# Patient Record
Sex: Female | Born: 1985 | Race: Black or African American | Hispanic: No | Marital: Single | Smoking: Never smoker
Health system: Southern US, Community
[De-identification: ages and names within clinical notes are randomized; demographics above are authoritative.]

## PROBLEM LIST (undated history)

## (undated) DIAGNOSIS — N83529 Torsion of fallopian tube, unspecified side: Secondary | ICD-10-CM

## (undated) DIAGNOSIS — Z8619 Personal history of other infectious and parasitic diseases: Secondary | ICD-10-CM

## (undated) DIAGNOSIS — Z98891 History of uterine scar from previous surgery: Secondary | ICD-10-CM

## (undated) HISTORY — DX: Personal history of other infectious and parasitic diseases: Z86.19

---

## 2009-06-08 HISTORY — PX: LEFT OOPHORECTOMY: SHX1961

## 2015-04-09 LAB — HM PAP SMEAR: HM Pap smear: NORMAL

## 2015-11-08 ENCOUNTER — Telehealth: Payer: Self-pay | Admitting: *Deleted

## 2015-11-08 NOTE — Telephone Encounter (Signed)
Unable to reach patient at time of pre-visit call. Left message for patient to return call when available.  

## 2015-11-11 ENCOUNTER — Ambulatory Visit (INDEPENDENT_AMBULATORY_CARE_PROVIDER_SITE_OTHER): Payer: Managed Care, Other (non HMO) | Admitting: Family

## 2015-11-11 ENCOUNTER — Encounter: Payer: Self-pay | Admitting: Family

## 2015-11-11 VITALS — BP 126/82 | HR 81 | Temp 98.6°F | Resp 16 | Ht 60.5 in | Wt 110.2 lb

## 2015-11-11 DIAGNOSIS — F411 Generalized anxiety disorder: Secondary | ICD-10-CM | POA: Insufficient documentation

## 2015-11-11 DIAGNOSIS — R519 Headache, unspecified: Secondary | ICD-10-CM | POA: Insufficient documentation

## 2015-11-11 DIAGNOSIS — R51 Headache: Secondary | ICD-10-CM | POA: Diagnosis not present

## 2015-11-11 LAB — TSH: TSH: 0.53 u[IU]/mL (ref 0.35–4.50)

## 2015-11-11 NOTE — Patient Instructions (Signed)
Please complete lab work prior to leaving. Contact [facility] to schedule an office visit with one of our therapists here at the [facility] office to discuss anxiety.  Their number is [phone] Please schedule a complete physical at the front desk.

## 2015-11-11 NOTE — Assessment & Plan Note (Signed)
Will obtain TSH to evaluate thyroid function and rule out Hyperthyroid. Will also refer to therapist for counseling. If anxiety state worsens or does not improve, could consider addition of medication.

## 2015-11-11 NOTE — Assessment & Plan Note (Signed)
Advised pt not to skip meals and to drink plenty of water.

## 2015-11-11 NOTE — Progress Notes (Signed)
Pre visit review using our clinic review tool, if applicable. No additional management support is needed unless otherwise documented below in the visit note. 

## 2015-11-11 NOTE — Progress Notes (Signed)
[facility]   Subjective:    Patient ID: Heather Welch, female    DOB: 01-08-1986, 30 y.o.   MRN: [id number]  HPI  Ms. Heather Welch is a 30 yr old female who presents today to establish care.  She has two concerns:  "racing thoughts"- Reports that this is new.  Began around November/december of last year.  Deep breathing helps. Reports that it was initially stressful at home due to issue with boyfriend. But now happening more at work.  Feels overwhelmed.  Reports that she has HA,  Rarely eats breakfast.  HA usually occurs before lunch.  Eats lunch and HA resolved.  Reports that she sleeps OK.    Sees Dr. Lovena Le in [place] annually for GYN.   Review of Systems  Constitutional: Negative for unexpected weight change.  HENT: Negative for hearing loss and rhinorrhea.   Eyes: Negative for visual disturbance.  Respiratory: Negative for cough.   Cardiovascular: Negative for leg swelling.  Gastrointestinal: Negative for diarrhea and constipation.  Genitourinary: Negative for dysuria and frequency.  Musculoskeletal: Negative for myalgias and arthralgias.  Skin: Negative for rash.  Neurological: Positive for headaches.  Hematological: Negative for adenopathy.  Psychiatric/Behavioral:       Denies depression   Past Medical History  Diagnosis Date  . History of chicken pox      Social History   Social History  . Marital Status: Single    Spouse Name: N/A  . Number of Children: N/A  . Years of Education: N/A   Occupational History  . Not on file.   Social History Main Topics  . Smoking status: Never Smoker   . Smokeless tobacco: Never Used  . Alcohol Use: Yes     Comment: 1-2 drinks Hennesy every other day  . Drug Use: No  . Sexual Activity: Yes    Birth Control/ Protection: Pill   Other Topics Concern  . 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  Graduated from [employer]   Has a dog who she enjoys, enjoys shopping.      Past Surgical History  Procedure Laterality Date  . Left oophorectomy Left 2011    torsion / cyst    Family History  Problem Relation Age of Onset  . Carpal tunnel syndrome Mother   . Hypertension Mother   . Stroke Father   . Hypertension Father   . Diabetes Father   . Stroke Maternal Grandfather   . Hypertension Maternal Grandfather   . Kidney disease Maternal Grandfather   . Cancer Paternal Grandmother     breast    No Known Allergies  No current outpatient prescriptions on file prior to visit.   No current facility-administered medications on file prior to visit.    BP 126/82 mmHg  Pulse 81  Temp(Src) 98.6 F (37 C) (Oral)  Resp 16  Ht 5' 0.5" (1.537 m)  Wt 110 lb 3.2 oz (49.986 kg)  BMI 21.16 kg/m2  SpO2 100%  LMP 07/10/2015       Objective:   Physical Exam  Constitutional: She is oriented to person, place, and time. She appears well-developed and well-nourished.  HENT:  Head: Normocephalic and atraumatic.  Mouth/Throat: Oropharynx is clear and moist.  Eyes: No scleral icterus.  Neck: Normal range of motion. No thyromegaly present.  Cardiovascular: Normal rate and regular rhythm.   No murmur  heard. Pulmonary/Chest: Effort normal and breath sounds normal. No respiratory distress. She has no wheezes. She has no rales.  Musculoskeletal: She exhibits no edema.  Lymphadenopathy:    She has no cervical adenopathy.  Neurological: She is alert and oriented to person, place, and time.  Skin: Skin is warm and dry.  Psychiatric: Her behavior is normal. Judgment and thought content normal.  Mildly anxious appearing          Assessment & Plan:

## 2015-12-13 ENCOUNTER — Encounter: Payer: Self-pay | Admitting: Family

## 2015-12-13 ENCOUNTER — Ambulatory Visit (INDEPENDENT_AMBULATORY_CARE_PROVIDER_SITE_OTHER): Payer: Managed Care, Other (non HMO) | Admitting: Family

## 2015-12-13 VITALS — BP 110/68 | HR 78 | Temp 98.0°F | Resp 16 | Ht 60.5 in | Wt 111.4 lb

## 2015-12-13 DIAGNOSIS — Z0000 Encounter for general adult medical examination without abnormal findings: Secondary | ICD-10-CM

## 2015-12-13 DIAGNOSIS — Z23 Encounter for immunization: Secondary | ICD-10-CM | POA: Diagnosis not present

## 2015-12-13 LAB — CBC WITH DIFFERENTIAL/PLATELET
BASOS PCT: 0.6 % (ref 0.0–3.0)
Basophils Absolute: 0 10*3/uL (ref 0.0–0.1)
EOS PCT: 1.8 % (ref 0.0–5.0)
Eosinophils Absolute: 0.1 10*3/uL (ref 0.0–0.7)
HEMATOCRIT: 37.5 % (ref 36.0–46.0)
Hemoglobin: 12.8 g/dL (ref 12.0–15.0)
LYMPHS ABS: 1.1 10*3/uL (ref 0.7–4.0)
LYMPHS PCT: 30.8 % (ref 12.0–46.0)
MCHC: 34.1 g/dL (ref 30.0–36.0)
MCV: 88.2 fl (ref 78.0–100.0)
MONOS PCT: 7.8 % (ref 3.0–12.0)
Monocytes Absolute: 0.3 10*3/uL (ref 0.1–1.0)
NEUTROS ABS: 2.1 10*3/uL (ref 1.4–7.7)
NEUTROS PCT: 59 % (ref 43.0–77.0)
PLATELETS: 306 10*3/uL (ref 150.0–400.0)
RBC: 4.25 Mil/uL (ref 3.87–5.11)
RDW: 12.9 % (ref 11.5–15.5)
WBC: 3.6 10*3/uL — ABNORMAL LOW (ref 4.0–10.5)

## 2015-12-13 LAB — LIPID PANEL
CHOLESTEROL: 166 mg/dL (ref 0–200)
HDL: 68.4 mg/dL (ref >39.00)
LDL Cholesterol: 91 mg/dL (ref 0–99)
NONHDL: 97.59
Total CHOL/HDL Ratio: 2
Triglycerides: 31 mg/dL (ref 0.0–149.0)
VLDL: 6.2 mg/dL (ref 0.0–40.0)

## 2015-12-13 LAB — BASIC METABOLIC PANEL
BUN: 18 mg/dL (ref 6–23)
CALCIUM: 9.7 mg/dL (ref 8.4–10.5)
CO2: 31 mEq/L (ref 19–32)
Chloride: 106 mEq/L (ref 96–112)
Creatinine, Ser: 0.57 mg/dL (ref 0.40–1.20)
GFR: 162.69 mL/min (ref >60.00)
GLUCOSE: 75 mg/dL (ref 70–99)
Potassium: 3.9 mEq/L (ref 3.5–5.1)
SODIUM: 140 meq/L (ref 135–145)

## 2015-12-13 LAB — URINALYSIS, ROUTINE W REFLEX MICROSCOPIC
Bilirubin Urine: NEGATIVE
Ketones, ur: NEGATIVE
LEUKOCYTES UA: NEGATIVE
NITRITE: NEGATIVE
PH: 5.5 (ref 5.0–8.0)
Specific Gravity, Urine: 1.02 (ref 1.000–1.030)
TOTAL PROTEIN, URINE-UPE24: NEGATIVE
URINE GLUCOSE: NEGATIVE
UROBILINOGEN UA: 0.2 (ref 0.0–1.0)

## 2015-12-13 LAB — HEPATIC FUNCTION PANEL
ALBUMIN: 4.3 g/dL (ref 3.5–5.2)
ALK PHOS: 31 U/L — AB (ref 39–117)
ALT: 10 U/L (ref 0–35)
AST: 17 U/L (ref 0–37)
Bilirubin, Direct: 0.1 mg/dL (ref 0.0–0.3)
TOTAL PROTEIN: 6.8 g/dL (ref 6.0–8.3)
Total Bilirubin: 0.3 mg/dL (ref 0.2–1.2)

## 2015-12-13 LAB — TSH: TSH: 0.68 u[IU]/mL (ref 0.35–4.50)

## 2015-12-13 NOTE — Progress Notes (Signed)
Subjective:    Patient ID: Heather Welch, female    DOB: 09/04/1985, 30 y.o.   MRN: [id number]  HPI  Patient presents today for complete physical.  Immunizations: due for tetanus Diet: could be better Exercise: Not exercising.  Pap Smear: 11/16, normal per patient, on junel for contraception. [facility]: up to date Vision: scheduled  Anxiety- reports she has not had intermittent anxiety. Has a lot of school work and grandfather passed.  Notes anxiety is not as often. Has not had time to establish with a therapist.   Review of Systems  Constitutional: Negative for unexpected weight change.  HENT: Negative for hearing loss and rhinorrhea.   Eyes: Negative for visual disturbance.  Respiratory: Negative for cough.   Cardiovascular: Negative for leg swelling.  Gastrointestinal: Negative for diarrhea and constipation.  Genitourinary: Negative for dysuria, frequency and menstrual problem.  Musculoskeletal: Negative for myalgias and arthralgias.  Skin: Negative for rash.  Neurological:       Headaches 2 x a day- notes that she still needs to drink more water, eating breakfast seems to be helping  Hematological: Negative for adenopathy.  Psychiatric/Behavioral:       Denies depression symptoms   Past Medical History  Diagnosis Date  . History of chicken pox      Social History   Social History  . Marital Status: Single    Spouse Name: N/A  . Number of Children: N/A  . Years of Education: N/A   Occupational History  . Not on file.   Social History Main Topics  . Smoking status: Never Smoker   . Smokeless tobacco: Never Used  . Alcohol Use: Yes     Comment: 1-2 drinks Hennesy every other day  . Drug Use: No  . Sexual Activity: Yes    Birth Control/ Protection: Pill   Other Topics Concern  . 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   Graduated from [employer]   Has a dog who she enjoys, enjoys shopping.      Past Surgical History  Procedure Laterality Date  . Left oophorectomy Left 2011    torsion / cyst    Family History  Problem Relation Age of Onset  . Carpal tunnel syndrome Mother   . Hypertension Mother   . Stroke Father   . Hypertension Father   . Diabetes Father   . Stroke Maternal Grandfather   . Hypertension Maternal Grandfather   . Kidney disease Maternal Grandfather   . Cancer Paternal Grandmother     breast    No Known Allergies  Current Outpatient Prescriptions on File Prior to Visit  Medication Sig Dispense Refill  . Biotin w/ Vitamins C & E (HAIR SKIN & NAILS GUMMIES PO) Take 2 each by mouth daily.    Colleen Can. JUNEL FE 1/20 1-20 MG-MCG tablet Take 1 tablet by mouth daily.     No current facility-administered medications on file prior to visit.    BP 110/68 mmHg  Pulse 78  Temp(Src) 98 F (36.7 C) (Oral)  Resp 16  Ht 5' 0.5" (1.537 m)  Wt 111 lb 6.4 oz (50.531 kg)  BMI 21.39 kg/m2  LMP 11/13/2015       Objective:   Physical Exam  Physical Exam  Constitutional: She is oriented to person, place, and time. She appears well-developed and well-nourished.  No distress.  HENT:  Head: Normocephalic and atraumatic.  Right Ear: cerumen occludes TM Left Ear: cerumen occludes TM Mouth/Throat: Oropharynx is clear and moist.  Eyes: Pupils are equal, round, and reactive to light. No scleral icterus.  Neck: Normal range of motion. No thyromegaly present.  Cardiovascular: Normal rate and regular rhythm.   No murmur heard. Pulmonary/Chest: Effort normal and breath sounds normal. No respiratory distress. He has no wheezes. She has no rales. She exhibits no tenderness.  Abdominal: Soft. Bowel sounds are normal. He exhibits no distension and no mass. There is no tenderness. There is no rebound and no guarding.  Musculoskeletal: She exhibits no edema.  Lymphadenopathy:    She has no cervical  adenopathy.  Neurological: She is alert and oriented to person, place, and time. She has normal patellar reflexes. She exhibits normal muscle tone. Coordination normal.  Skin: Skin is warm and dry.  Psychiatric: She has a normal mood and affect. Her behavior is normal. Judgment and thought content normal.  Breasts: Examined lying Right: Without masses, retractions, discharge or axillary adenopathy.  Left: Without masses, retractions, discharge or axillary adenopathy.  Pelvis:  deferred        Assessment & Plan:         Assessment & Plan:  Pt counseled on use of otc cerumen removal kits for ceruminosis  Preventative health care- Tdap due today. Discussed healthy diet, exercise. Obtain routine lab work.    Sandford CrazeMelissa O'Sullivan NP

## 2015-12-13 NOTE — Progress Notes (Signed)
Pre visit review using our clinic review tool, if applicable. No additional management support is needed unless otherwise documented below in the visit note. 

## 2015-12-13 NOTE — Addendum Note (Signed)
Addended by: Mervin KungFERGERSON, Logann Whitebread A on: 12/13/2015 01:40 PM   Modules accepted: Orders, SmartSet

## 2015-12-13 NOTE — Patient Instructions (Signed)
Please complete lab work prior to leaving. Try to add regular exercise and focus on healthy diet (more fresh fruits/veggies and water). Call if anxiety symptoms worsen. Follow up in 1 year for annual physical, sooner if problems/concerns.

## 2016-01-16 ENCOUNTER — Telehealth: Payer: Self-pay | Admitting: Family

## 2016-01-16 NOTE — Telephone Encounter (Signed)
PT DROPPED OFF A HEALTH SCREENING FORM FOR [facility] TO FILL OUT, DOCUMENTS PLACED IN TRAY AT FRONT OFFICE, PT STATES SHE WILL PICK UP WHEN READY

## 2016-01-20 NOTE — Telephone Encounter (Signed)
Pt returned call regarding about paperwork. Please advise. Ok to call the same tel #.

## 2016-01-20 NOTE — Telephone Encounter (Addendum)
Health Appeal Form received.   Form filled in as much as possible and [facility] and [facility].

## 2016-01-23 NOTE — Telephone Encounter (Signed)
Pt notified and made aware that form is available for pick up.

## 2016-01-23 NOTE — Telephone Encounter (Signed)
Form completed and signed by provider.  Copy sent for scanning.  Original placed up from for pick up.   

## 2016-03-24 ENCOUNTER — Telehealth: Payer: Self-pay | Admitting: Family

## 2016-03-24 NOTE — Telephone Encounter (Signed)
Patient called stating that she is having severe throat pain. Transferred to [facility] to speak with a nurse

## 2016-03-25 NOTE — Telephone Encounter (Signed)
[facility] note received via fax  Call:   Date: 03/24/16 Time: 1757   Caller: Self Return number: [phone]  Nurse: Cordella RegisterJoshua Wright, RN  Chief Complaint: Mouth Symptoms  Reason for call: Caller states that she has mouth pain she developed an ulcer near her wisdom teeth. Wisdom tooth pain is on left side on top near her wisdom tooth. She can eat, acidic is still painful, feels like the area is blistered over and now there is an ulcer behind her wisdom tooth. There is also a bump on her tongue. Her soft pallet is painful at times, and the ulcer tingles and burns at times. These sites have been bothering her for about 2 weeks.  Related visit to physician within the last 2 weeks: No  Guideline: Mouth Symptoms; all other mouth symptoms (Exceptions: dry mouth from not drinks enough liquids, chapped lips)  Disposition: See PCP within 2 weeks   **Called pt, number listed in invalid. Unable to reach pt to offer to schedule appointment.**

## 2017-05-10 LAB — HM PAP SMEAR: HM PAP: NEGATIVE

## 2018-01-21 ENCOUNTER — Encounter: Payer: Self-pay | Admitting: Family

## 2018-01-21 ENCOUNTER — Ambulatory Visit: Payer: Managed Care, Other (non HMO) | Admitting: Family

## 2018-01-21 VITALS — BP 121/76 | Temp 99.1°F | Resp 16 | Ht 60.0 in | Wt 140.6 lb

## 2018-01-21 DIAGNOSIS — M6740 Ganglion, unspecified site: Secondary | ICD-10-CM | POA: Diagnosis not present

## 2018-01-21 NOTE — Patient Instructions (Signed)
Ganglion Cyst A ganglion cyst is a noncancerous, fluid-filled lump that occurs near joints or tendons. The ganglion cyst grows out of a joint or the lining of a tendon. It most often develops in the hand or wrist, but it can also develop in the shoulder, elbow, hip, knee, ankle, or foot. The round or oval ganglion cyst can be the size of a pea or larger than a grape. Increased activity may enlarge the size of the cyst because more fluid starts to build up. What are the causes? It is not known what causes a ganglion cyst to grow. However, it may be related to:  Inflammation or irritation around the joint.  An injury.  Repetitive movements or overuse.  Arthritis.  What increases the risk? Risk factors include:  Being a woman.  Being age 20-50.  What are the signs or symptoms? Symptoms may include:  A lump. This most often appears on the hand or wrist, but it can occur in other areas of the body.  Tingling.  Pain.  Numbness.  Muscle weakness.  Weak grip.  Less movement in a joint.  How is this diagnosed? Ganglion cysts are most often diagnosed based on a physical exam. Your health care provider will feel the lump and may shine a light alongside it. If it is a ganglion cyst, a light often shines through it. Your health care provider may order an X-ray, ultrasound, or MRI to rule out other conditions. How is this treated? Ganglion cysts usually go away on their own without treatment. If pain or other symptoms are involved, treatment may be needed. Treatment is also needed if the ganglion cyst limits your movement or if it gets infected. Treatment may include:  Wearing a brace or splint on your wrist or finger.  Taking anti-inflammatory medicine.  Draining fluid from the lump with a needle (aspiration).  Injecting a steroid into the joint.  Surgery to remove the ganglion cyst.  Follow these instructions at home:  Do not press on the ganglion cyst, poke it with a  needle, or hit it.  Take medicines only as directed by your health care provider.  Wear your brace or splint as directed by your health care provider.  Watch your ganglion cyst for any changes.  Keep all follow-up visits as directed by your health care provider. This is important. Contact a health care provider if:  Your ganglion cyst becomes larger or more painful.  You have increased redness, red streaks, or swelling.  You have pus coming from the lump.  You have weakness or numbness in the affected area.  You have a fever or chills. This information is not intended to replace advice given to you by your health care provider. Make sure you discuss any questions you have with your health care provider. Document Released: 05/22/2000 Document Revised: 10/31/2015 Document Reviewed: 11/07/2013 Elsevier Interactive Patient Education  2018 Elsevier Inc.  

## 2018-01-21 NOTE — Progress Notes (Signed)
Subjective:    Patient ID: Heather Welch, female    DOB: 03/03/1986, 32 y.o.   MRN: [id number]  HPI   Patient is a 32 yr old female who presents today with chief complaint of cyst on her right wrist.  Denies associated pain.     Review of Systems See HPI  Past Medical History:  Diagnosis Date  . History of chicken pox      Social History   Socioeconomic History  . Marital status: Single    Spouse name: Not on file  . Number of children: Not on file  . Years of education: Not on file  . Highest education level: Not on file  Occupational History  . Not on file  Social Needs  . Financial resource strain: Not on file  . Food insecurity:    Worry: Not on file    Inability: Not on file  . Transportation needs:    Medical: Not on file    Non-medical: Not on file  Tobacco Use  . Smoking status: Never Smoker  . Smokeless tobacco: Never Used  Substance and Sexual Activity  . Alcohol use: Yes    Comment: 1-2 drinks Hennesy every other day  . Drug use: No  . Sexual activity: Yes    Birth control/protection: Pill  Lifestyle  . Physical activity:    Days per week: Not on file    Minutes per session: Not on file  . Stress: Not on file  Relationships  . Social connections:    Talks on phone: Not on file    Gets together: Not on file    Attends religious service: Not on file    Active member of club or organization: Not on file    Attends meetings of clubs or organizations: Not on file    Relationship status: Not on file  . Intimate partner violence:    Fear of current or ex partner: Not on file    Emotionally abused: Not on file    Physically abused: Not on file    Forced sexual activity: Not on file  Other Topics Concern  . 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  Graduated from [employer]   Has a dog who she enjoys, enjoys shopping.      Past Surgical  History:  Procedure Laterality Date  . LEFT OOPHORECTOMY Left 2011   torsion / cyst    Family History  Problem Relation Age of Onset  . Carpal tunnel syndrome Mother   . Hypertension Mother   . Stroke Father   . Hypertension Father   . Diabetes Father   . Stroke Maternal Grandfather   . Hypertension Maternal Grandfather   . Kidney disease Maternal Grandfather   . Cancer Paternal Grandmother        breast    No Known Allergies  Current Outpatient Medications on File Prior to Visit  Medication Sig Dispense Refill  . Prenatal Vit-DSS-Fe Fum-FA (PRENATAL 19) tablet Take 1 tablet by mouth daily.  3   No current facility-administered medications on file prior to visit.     BP 121/76 (BP Location: Right Arm, Patient Position: Sitting, Cuff Size: Small)   Temp 99.1 F (37.3 C) (Oral)   Resp 16   Ht 5' (1.524 m)   Wt 140 lb 9.6 oz (63.8 kg)   LMP 01/09/2018  SpO2 100%   BMI 27.46 kg/m       Objective:   Physical Exam  Constitutional: She is oriented to person, place, and time. She appears well-developed and well-nourished.  Pulmonary/Chest: Effort normal. She has no wheezes.  Musculoskeletal:  Small mobile ganglion cyst noted righ tlateral wrist.  Neurological: She is alert and oriented to person, place, and time.  Skin: Skin is warm and dry.  Psychiatric: She has a normal mood and affect. Her behavior is normal. Judgment and thought content normal.          Assessment & Plan:  Ganglion cyst- reassurance provided.  Advised pt to let me know if enlarged or becomes painful.

## 2018-02-28 ENCOUNTER — Encounter: Payer: Self-pay | Admitting: Family

## 2018-02-28 ENCOUNTER — Ambulatory Visit (INDEPENDENT_AMBULATORY_CARE_PROVIDER_SITE_OTHER): Payer: Managed Care, Other (non HMO) | Admitting: Family

## 2018-02-28 VITALS — BP 120/82 | HR 83 | Temp 98.3°F | Resp 18 | Ht 60.0 in | Wt 141.2 lb

## 2018-02-28 DIAGNOSIS — Z23 Encounter for immunization: Secondary | ICD-10-CM | POA: Diagnosis not present

## 2018-02-28 DIAGNOSIS — Z0000 Encounter for general adult medical examination without abnormal findings: Secondary | ICD-10-CM

## 2018-02-28 NOTE — Patient Instructions (Addendum)
Please complete lab work prior to leaving.  Try to add 30 minutes of cardio 5 days a week. Schedule follow up with GYN for pap smear.  Try to prepare fresh healthy foods at home ahead of time and then bring food with you to work. Eat 3 meals a day and make sure you have a fresh fruit and/or veggie with each meal as well as a lean protein (low fat yogurt/cottage cheese, chicken, Malaw[place]urkey or fish). Goal weight <120.

## 2018-02-28 NOTE — Progress Notes (Signed)
Subjective:    Patient ID: Heather Welch, female    DOB: [birth year], 32 y.o.   MRN: [id number]  HPI  Patient presents today for complete physical.  Immunizations: tdap 2017, flu shot today Diet: diet is fair Exercise: not exercising, active at work.  Pap Smear: Due in November.   Vision: due will schedule Dental:up to date Wt Readings from Last 3 Encounters:  02/28/18 141 lb 3.2 oz (64 kg)  01/21/18 140 lb 9.6 oz (63.8 kg)  12/13/15 111 lb 6.4 oz (50.5 kg)    Review of Systems  Constitutional: Negative for unexpected weight change.  HENT: Negative for hearing loss and rhinorrhea.   Eyes: Negative for visual disturbance.  Respiratory: Negative for cough.   Cardiovascular: Negative for leg swelling.  Gastrointestinal: Negative for blood in stool, constipation and diarrhea.  Genitourinary: Negative for dysuria, frequency and hematuria.  Musculoskeletal: Negative for arthralgias and myalgias.  Neurological: Negative for headaches.  Hematological: Negative for adenopathy.  Psychiatric/Behavioral:       Denies depression/anxiety   Past Medical History:  Diagnosis Date  . History of chicken pox      Social History   Socioeconomic History  . Marital status: Single    Spouse name: Not on file  . Number of children: Not on file  . Years of education: Not on file  . Highest education level: Not on file  Occupational History  . Not on file  Social Needs  . Financial resource strain: Not on file  . Food insecurity:    Worry: Not on file    Inability: Not on file  . Transportation needs:    Medical: Not on file    Non-medical: Not on file  Tobacco Use  . Smoking status: Never Smoker  . Smokeless tobacco: Never Used  Substance and Sexual Activity  . Alcohol use: Yes    Comment: 1-2 drinks [place] every weekend  . Drug use: No  . Sexual activity: Yes    Birth control/protection: Pill  Lifestyle  . Physical activity:    Days per week: Not on file    Minutes per  session: Not on file  . Stress: Not on file  Relationships  . Social connections:    Talks on phone: Not on file    Gets together: Not on file    Attends religious service: Not on file    Active member of club or organization: Not on file    Attends meetings of clubs or organizations: Not on file    Relationship status: Not on file  . Intimate partner violence:    Fear of current or ex partner: Not on file    Emotionally abused: Not on file    Physically abused: Not on file    Forced sexual activity: Not on file  Other Topics Concern  . 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  Graduated from [employer]   Has a dog who she enjoys, enjoys shopping.      Past Surgical History:  Procedure Laterality Date  . LEFT OOPHORECTOMY Left 2011   torsion / cyst    Family History  Problem Relation Age of Onset  . Carpal tunnel syndrome Mother   . Hypertension Mother   . Stroke Father   . Hypertension Father   . Diabetes Father   . Stroke Maternal Grandfather   .  Hypertension Maternal Grandfather   . Kidney disease Maternal Grandfather   . Cancer Paternal Grandmother        breast    No Known Allergies  Current Outpatient Medications on File Prior to Visit  Medication Sig Dispense Refill  . Prenatal Vit-DSS-Fe Fum-FA (PRENATAL 19) tablet Take 1 tablet by mouth daily.  3   No current facility-administered medications on file prior to visit.     BP 120/82 (BP Location: Right Arm, Cuff Size: Normal)   Pulse 83   Temp 98.3 F (36.8 C) (Oral)   Resp 18   Ht 5' (1.524 m)   Wt 141 lb 3.2 oz (64 kg)   LMP 02/27/2018   SpO2 99%   BMI 27.58 kg/m       Objective:   Physical Exam  Physical Exam  Constitutional: She is oriented to person, place, and time. She appears well-developed and well-nourished. No distress.  HENT:  Head: Normocephalic and atraumatic.  Right  Ear: Tympanic membrane and ear canal normal.  Left Ear: Tympanic membrane and ear canal normal.  Mouth/Throat: Oropharynx is clear and moist.  Eyes: Pupils are equal, round, and reactive to light. No scleral icterus.  Neck: Normal range of motion. No thyromegaly present.  Cardiovascular: Normal rate and regular rhythm.   No murmur heard. Pulmonary/Chest: Effort normal and breath sounds normal. No respiratory distress. He has no wheezes. She has no rales. She exhibits no tenderness.  Abdominal: Soft. Bowel sounds are normal. She exhibits no distension and no mass. There is no tenderness. There is no rebound and no guarding.  Musculoskeletal: She exhibits no edema.  Lymphadenopathy:    She has no cervical adenopathy.  Neurological: She is alert and oriented to person, place, and time. She has normal patellar reflexes. She exhibits normal muscle tone. Coordination normal.  Skin: Skin is warm and dry.  Psychiatric: She has a normal mood and affect. Her behavior is normal. Judgment and thought content normal.  Breasts: Examined lying Right: Without masses, retractions, discharge or axillary adenopathy.  Left: Without masses, retractions, discharge or axillary adenopathy.  Pelvic: deferred to GYN.             Assessment & Plan:   Preventative care- flu shot today, will schedule pap with GYN.  Obtain routine lab work. Pt is advised as follows:  Please complete lab work prior to leaving.  Try to add 30 minutes of cardio 5 days a week. Schedule follow up with GYN for pap smear.  Try to prepare fresh healthy foods at home ahead of time and then bring food with you to work. Eat 3 meals a day and make sure you have a fresh fruit and/or veggie with each meal as well as a lean protein (low fat yogurt/cottage cheese, chicken, Malaw[place]urkey or fish). Goal weight <120.        Assessment & Plan:

## 2018-02-28 NOTE — Addendum Note (Signed)
Addended by: Mervin KungFERGERSON, Iana Buzan A on: 02/28/2018 04:43 PM   Modules accepted: Orders

## 2018-03-01 LAB — LIPID PANEL
Cholesterol: 166 mg/dL (ref 0–200)
HDL: 56.3 mg/dL (ref >39.00)
LDL CALC: 87 mg/dL (ref 0–99)
NONHDL: 109.53
Total CHOL/HDL Ratio: 3
Triglycerides: 113 mg/dL (ref 0.0–149.0)
VLDL: 22.6 mg/dL (ref 0.0–40.0)

## 2018-03-01 LAB — CBC WITH DIFFERENTIAL/PLATELET
BASOS ABS: 0.1 10*3/uL (ref 0.0–0.1)
Basophils Relative: 1.1 % (ref 0.0–3.0)
EOS ABS: 0.1 10*3/uL (ref 0.0–0.7)
Eosinophils Relative: 1.7 % (ref 0.0–5.0)
HCT: 39.2 % (ref 36.0–46.0)
Hemoglobin: 13 g/dL (ref 12.0–15.0)
LYMPHS ABS: 2 10*3/uL (ref 0.7–4.0)
Lymphocytes Relative: 31.9 % (ref 12.0–46.0)
MCHC: 33.2 g/dL (ref 30.0–36.0)
MCV: 91.2 fl (ref 78.0–100.0)
MONO ABS: 0.5 10*3/uL (ref 0.1–1.0)
MONOS PCT: 8.3 % (ref 3.0–12.0)
NEUTROS PCT: 57 % (ref 43.0–77.0)
Neutro Abs: 3.5 10*3/uL (ref 1.4–7.7)
PLATELETS: 281 10*3/uL (ref 150.0–400.0)
RBC: 4.31 Mil/uL (ref 3.87–5.11)
RDW: 13 % (ref 11.5–15.5)
WBC: 6.2 10*3/uL (ref 4.0–10.5)

## 2018-03-01 LAB — HEPATIC FUNCTION PANEL
ALK PHOS: 47 U/L (ref 39–117)
ALT: 12 U/L (ref 0–35)
AST: 18 U/L (ref 0–37)
Albumin: 4.5 g/dL (ref 3.5–5.2)
BILIRUBIN DIRECT: 0.1 mg/dL (ref 0.0–0.3)
BILIRUBIN TOTAL: 0.3 mg/dL (ref 0.2–1.2)
Total Protein: 6.8 g/dL (ref 6.0–8.3)

## 2018-03-01 LAB — BASIC METABOLIC PANEL
BUN: 17 mg/dL (ref 6–23)
CHLORIDE: 102 meq/L (ref 96–112)
CO2: 29 mEq/L (ref 19–32)
Calcium: 9.8 mg/dL (ref 8.4–10.5)
Creatinine, Ser: 0.74 mg/dL (ref 0.40–1.20)
GFR: 118.52 mL/min (ref >60.00)
GLUCOSE: 82 mg/dL (ref 70–99)
POTASSIUM: 4 meq/L (ref 3.5–5.1)
SODIUM: 137 meq/L (ref 135–145)

## 2018-03-01 LAB — URINALYSIS, ROUTINE W REFLEX MICROSCOPIC
Bilirubin Urine: NEGATIVE
Ketones, ur: NEGATIVE
Leukocytes, UA: NEGATIVE
Nitrite: NEGATIVE
SPECIFIC GRAVITY, URINE: 1.02 (ref 1.000–1.030)
TOTAL PROTEIN, URINE-UPE24: NEGATIVE
Urine Glucose: NEGATIVE
Urobilinogen, UA: 0.2 (ref 0.0–1.0)
pH: 6.5 (ref 5.0–8.0)

## 2018-03-01 LAB — TSH: TSH: 0.55 u[IU]/mL (ref 0.35–4.50)

## 2018-03-07 ENCOUNTER — Encounter: Payer: Self-pay | Admitting: Family

## 2018-03-31 ENCOUNTER — Telehealth: Payer: Self-pay | Admitting: *Deleted

## 2018-03-31 NOTE — Telephone Encounter (Signed)
Received Medical records from [facility]; forwarded to provider/SLS 10/24

## 2018-05-23 ENCOUNTER — Encounter: Payer: Self-pay | Admitting: Family

## 2018-05-23 ENCOUNTER — Ambulatory Visit: Payer: Managed Care, Other (non HMO) | Admitting: Family

## 2018-05-23 VITALS — BP 128/87 | HR 71 | Temp 99.8°F | Resp 16 | Ht 60.0 in | Wt 136.0 lb

## 2018-05-23 DIAGNOSIS — R109 Unspecified abdominal pain: Secondary | ICD-10-CM | POA: Diagnosis not present

## 2018-05-23 LAB — POC URINALSYSI DIPSTICK (AUTOMATED)
Bilirubin, UA: NEGATIVE
Glucose, UA: NEGATIVE
Ketones, UA: NEGATIVE
LEUKOCYTES UA: NEGATIVE
Nitrite, UA: NEGATIVE
Protein, UA: POSITIVE — AB
Spec Grav, UA: 1.015 (ref 1.010–1.025)
UROBILINOGEN UA: NEGATIVE U/dL — AB
pH, UA: 6 (ref 5.0–8.0)

## 2018-05-23 LAB — POCT URINE PREGNANCY: PREG TEST UR: NEGATIVE

## 2018-05-23 NOTE — Patient Instructions (Addendum)
Please complete lab work prior to leaving. Call if symptoms worsen or if symptoms do not continue to improve.  We will call you in the AM about scheduling your CT scan to rule out kidney stone.

## 2018-05-23 NOTE — Progress Notes (Signed)
Subjective:    Patient ID: Heather Welch, female    DOB: [birth year], 32 y.o.   MRN: [id number]  HPI  Right sided flank pain on Friday night. Could not get comfortable. Then developed nausea/vomitted x 1 Friday night.  Notes that she took excedrin PM, woke up at 2AM and vomited again. Went ot the urgent care and was prescribed ibuprofen 600mg .  Reports that she had microscopic blood in her urine but that she also had her menstrual cycle that day. She is trying to get pregnant.  LMP started 05/19/18.  She reports resolution of her pain today.  Today she has had no pain- like 2-3/10.    Review of Systems  See HPI  Past Medical History:  Diagnosis Date  . History of chicken pox      Social History   Socioeconomic History  . Marital status: Single    Spouse name: Not on file  . Number of children: Not on file  . Years of education: Not on file  . Highest education level: Not on file  Occupational History  . Not on file  Social Needs  . Financial resource strain: Not on file  . Food insecurity:    Worry: Not on file    Inability: Not on file  . Transportation needs:    Medical: Not on file    Non-medical: Not on file  Tobacco Use  . Smoking status: Never Smoker  . Smokeless tobacco: Never Used  Substance and Sexual Activity  . Alcohol use: Yes    Comment: 1-2 drinks [place] every weekend  . Drug use: No  . Sexual activity: Yes    Birth control/protection: Pill  Lifestyle  . Physical activity:    Days per week: Not on file    Minutes per session: Not on file  . Stress: Not on file  Relationships  . Social connections:    Talks on phone: Not on file    Gets together: Not on file    Attends religious service: Not on file    Active member of club or organization: Not on file    Attends meetings of clubs or organizations: Not on file    Relationship status: Not on file  . Intimate partner violence:    Fear of current or ex partner: Not on file    Emotionally abused:  Not on file    Physically abused: Not on file    Forced sexual activity: Not on file  Other Topics Concern  . 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  Graduated from [employer]   Has a dog who she enjoys, enjoys shopping.      Past Surgical History:  Procedure Laterality Date  . LEFT OOPHORECTOMY Left 2011   torsion / cyst    Family History  Problem Relation Age of Onset  . Carpal tunnel syndrome Mother   . Hypertension Mother   . Stroke Father   . Hypertension Father   . Diabetes Father   . Stroke Maternal Grandfather   . Hypertension Maternal Grandfather   . Kidney disease Maternal Grandfather   . Cancer Paternal Grandmother        breast    No Known Allergies  Current Outpatient Medications on File Prior to Visit  Medication Sig Dispense Refill  . Prenatal Vit-DSS-Fe Fum-FA (PRENATAL 19) tablet Take 1 tablet by  mouth daily.  3   No current facility-administered medications on file prior to visit.     BP 128/87 (BP Location: Right Arm, Patient Position: Sitting, Cuff Size: Small)   Pulse 71   Temp 99.8 F (37.7 C) (Oral)   Resp 16   Ht 5' (1.524 m)   Wt 136 lb (61.7 kg)   LMP 05/19/2018   SpO2 100%   BMI 26.56 kg/m        Objective:   Physical Exam Constitutional:      Appearance: She is well-developed.  Neck:     Musculoskeletal: Neck supple.     Thyroid: No thyromegaly.  Cardiovascular:     Rate and Rhythm: Normal rate and regular rhythm.     Heart sounds: Normal heart sounds. No murmur.  Pulmonary:     Effort: Pulmonary effort is normal. No respiratory distress.     Breath sounds: Normal breath sounds. No wheezing.  Abdominal:     General: There is no distension.     Tenderness: There is no abdominal tenderness.  Skin:    General: Skin is warm and dry.  Neurological:     Mental Status: She is alert and oriented to person, place, and  time.  Psychiatric:        Behavior: Behavior normal.        Thought Content: Thought content normal.        Judgment: Judgment normal.           Assessment & Plan:  Right sided flank pain- low grade temp. Will send UA for culture and and obtain cbc, bmet. Will also obtain a CT to rule out stone.

## 2018-05-24 ENCOUNTER — Telehealth: Payer: Self-pay | Admitting: Family

## 2018-05-24 LAB — CBC WITH DIFFERENTIAL/PLATELET
BASOS PCT: 1 % (ref 0.0–3.0)
Basophils Absolute: 0.1 10*3/uL (ref 0.0–0.1)
EOS PCT: 1.1 % (ref 0.0–5.0)
Eosinophils Absolute: 0.1 10*3/uL (ref 0.0–0.7)
HEMATOCRIT: 40.2 % (ref 36.0–46.0)
HEMOGLOBIN: 13.4 g/dL (ref 12.0–15.0)
LYMPHS PCT: 31.8 % (ref 12.0–46.0)
Lymphs Abs: 1.9 10*3/uL (ref 0.7–4.0)
MCHC: 33.4 g/dL (ref 30.0–36.0)
MCV: 91.8 fl (ref 78.0–100.0)
Monocytes Absolute: 0.6 10*3/uL (ref 0.1–1.0)
Monocytes Relative: 9.9 % (ref 3.0–12.0)
Neutro Abs: 3.4 10*3/uL (ref 1.4–7.7)
Neutrophils Relative %: 56.2 % (ref 43.0–77.0)
Platelets: 285 10*3/uL (ref 150.0–400.0)
RBC: 4.38 Mil/uL (ref 3.87–5.11)
RDW: 12.9 % (ref 11.5–15.5)
WBC: 6.1 10*3/uL (ref 4.0–10.5)

## 2018-05-24 LAB — BASIC METABOLIC PANEL
BUN: 18 mg/dL (ref 6–23)
CHLORIDE: 105 meq/L (ref 96–112)
CO2: 29 mEq/L (ref 19–32)
Calcium: 9.8 mg/dL (ref 8.4–10.5)
Creatinine, Ser: 1.05 mg/dL (ref 0.40–1.20)
GFR: 79.02 mL/min (ref >60.00)
Glucose, Bld: 91 mg/dL (ref 70–99)
POTASSIUM: 5.1 meq/L (ref 3.5–5.1)
SODIUM: 143 meq/L (ref 135–145)

## 2018-05-24 NOTE — Telephone Encounter (Signed)
Heather MouldingRuth- I spoke with pt and she states she has not had her CT scheduled yet, feeling about the same. Can you please check with Dedra SkeensGwen and make sure this gets scheduled on 12/18?

## 2018-05-25 ENCOUNTER — Ambulatory Visit (HOSPITAL_BASED_OUTPATIENT_CLINIC_OR_DEPARTMENT_OTHER)
Admission: RE | Admit: 2018-05-25 | Discharge: 2018-05-25 | Disposition: A | Discharge status: Home / Self Care | Payer: Managed Care, Other (non HMO) | Source: Ambulatory Visit | Attending: Family | Admitting: Family

## 2018-05-25 ENCOUNTER — Other Ambulatory Visit: Payer: Self-pay

## 2018-05-25 DIAGNOSIS — R3129 Other microscopic hematuria: Secondary | ICD-10-CM | POA: Diagnosis present

## 2018-05-25 DIAGNOSIS — R109 Unspecified abdominal pain: Secondary | ICD-10-CM | POA: Diagnosis not present

## 2018-05-25 LAB — URINE CULTURE
MICRO NUMBER:: 91508701
SPECIMEN QUALITY:: ADEQUATE

## 2018-05-25 NOTE — Telephone Encounter (Signed)
Ct was cancelled, Ok per FleetwoodMelissa she will get a rus and abd xray. Patient advised test have been order she will call radiology to get a time for this studies.

## 2018-05-26 ENCOUNTER — Telehealth: Payer: Self-pay | Admitting: Internal Medicine

## 2018-05-26 NOTE — Telephone Encounter (Signed)
Advise patient: Urine culture was negative, renal ultrasound showed no problems on her kidneys; she does have uterine fibroid and needs to discuss that with her gynecologist.  The x-ray of the abdomen was normal. She was seen with right-sided flank pain and a low-grade temp.  No evidence of infection, if she is not much better let me know.  If she is feeling a lot worse she needs to be seen tomorrow

## 2018-05-26 NOTE — Telephone Encounter (Signed)
Results given to patient, she reports she is feeling better. She asked for her results to be fax[facility] [place]

## 2018-05-27 NOTE — Telephone Encounter (Signed)
Records faxed to her OBGYN

## 2018-08-19 ENCOUNTER — Other Ambulatory Visit: Payer: Self-pay

## 2018-08-19 ENCOUNTER — Encounter: Payer: Self-pay | Admitting: Family

## 2018-08-19 ENCOUNTER — Other Ambulatory Visit (HOSPITAL_COMMUNITY)
Admission: RE | Admit: 2018-08-19 | Discharge: 2018-08-19 | Disposition: A | Discharge status: Home / Self Care | Payer: Managed Care, Other (non HMO) | Source: Ambulatory Visit | Attending: Family | Admitting: Family

## 2018-08-19 ENCOUNTER — Ambulatory Visit: Payer: Managed Care, Other (non HMO) | Admitting: Family

## 2018-08-19 VITALS — BP 118/75 | HR 82 | Temp 99.0°F | Resp 16 | Ht 60.0 in | Wt 139.0 lb

## 2018-08-19 DIAGNOSIS — N760 Acute vaginitis: Secondary | ICD-10-CM | POA: Diagnosis not present

## 2018-08-19 MED ORDER — FLUCONAZOLE 150 MG PO TABS: 150.0000 mg | ORAL_TABLET | Freq: Once | ORAL | 0 refills | Status: AC

## 2018-08-19 NOTE — Patient Instructions (Signed)
Please continue metronidazole. Start diflucan. We will contact you with your results.

## 2018-08-19 NOTE — Addendum Note (Signed)
Addended by: Wilford Corner on: 08/19/2018 03:41 PM   Modules accepted: Orders

## 2018-08-19 NOTE — Progress Notes (Signed)
Subjective:    Patient ID: Heather Welch, female    DOB: June 15, 1985, 33 y.o.   MRN: [id number]  HPI  Patient is a 33 year old female who presents today with chief complaint of vaginal discharge.  She reports that discharge began on 08/17/2018.  She saw gynecology that day and was given a prescription for metronidazole that same day.  She reports that she developed some vaginal itching which started last night.  Reports that discharge was initially white, now more cloudy.  Reports that she now is having some vulvar itching.  Denies new partners.    Review of Systems See HPI  Past Medical History:  Diagnosis Date  . History of chicken pox      Social History   Socioeconomic History  . Marital status: Single    Spouse name: Not on file  . Number of children: Not on file  . Years of education: Not on file  . Highest education level: Not on file  Occupational History  . Not on file  Social Needs  . Financial resource strain: Not on file  . Food insecurity:    Worry: Not on file    Inability: Not on file  . Transportation needs:    Medical: Not on file    Non-medical: Not on file  Tobacco Use  . Smoking status: Never Smoker  . Smokeless tobacco: Never Used  Substance and Sexual Activity  . Alcohol use: Yes    Comment: 1-2 drinks [place] every weekend  . Drug use: No  . Sexual activity: Yes    Birth control/protection: Pill  Lifestyle  . Physical activity:    Days per week: Not on file    Minutes per session: Not on file  . Stress: Not on file  Relationships  . Social connections:    Talks on phone: Not on file    Gets together: Not on file    Attends religious service: Not on file    Active member of club or organization: Not on file    Attends meetings of clubs or organizations: Not on file    Relationship status: Not on file  . Intimate partner violence:    Fear of current or ex partner: Not on file    Emotionally abused: Not on file    Physically abused:  Not on file    Forced sexual activity: Not on file  Other Topics Concern  . 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  Graduated from [employer]   Has a dog who she enjoys, enjoys shopping.      Past Surgical History:  Procedure Laterality Date  . LEFT OOPHORECTOMY Left 2011   torsion / cyst    Family History  Problem Relation Age of Onset  . Carpal tunnel syndrome Mother   . Hypertension Mother   . Stroke Father   . Hypertension Father   . Diabetes Father   . Stroke Maternal Grandfather   . Hypertension Maternal Grandfather   . Kidney disease Maternal Grandfather   . Cancer Paternal Grandmother        breast    No Known Allergies  Current Outpatient Medications on File Prior to Visit  Medication Sig Dispense Refill  . metroNIDAZOLE (FLAGYL) 500 MG tablet     . Prenatal Vit-DSS-Fe Fum-FA (PRENATAL 19) tablet Take 1 tablet by mouth daily.  3  No current facility-administered medications on file prior to visit.     BP 118/75 (BP Location: Right Arm, Patient Position: Sitting, Cuff Size: Small)   Pulse 82   Temp 99 F (37.2 C) (Oral)   Resp 16   Ht 5' (1.524 m)   Wt 139 lb (63 kg)   LMP 08/04/2018   SpO2 100%   BMI 27.15 kg/m       Objective:   Physical Exam Constitutional:      Appearance: Normal appearance.  Neurological:     General: No focal deficit present.     Mental Status: She is alert.  Psychiatric:        Mood and Affect: Mood normal.        Behavior: Behavior normal.   GYN:  Thin white vaginal discharge        Assessment & Plan:  Vaginitis- new.  Will rx with diflucan. Continue metronidazole pending review of swab results. Swab to be sent for BV, Yeast, GC/Chlamydia, trich.

## 2018-08-22 LAB — CERVICOVAGINAL ANCILLARY ONLY
Bacterial vaginitis: POSITIVE — AB
Candida vaginitis: POSITIVE — AB
Chlamydia: NEGATIVE
Neisseria Gonorrhea: NEGATIVE
TRICH (WINDOWPATH): NEGATIVE

## 2018-08-23 ENCOUNTER — Telehealth: Payer: Self-pay | Admitting: Family

## 2018-08-23 MED ORDER — FLUCONAZOLE 150 MG PO TABS: ORAL_TABLET | ORAL | 0 refills | Status: DC

## 2018-08-23 NOTE — Telephone Encounter (Signed)
Please contact pt and let her know that her wet prep shows bacterial vaginosis and yeast. Complete metronidazole for BV. Rx sent for diflucan for yeast.

## 2018-08-23 NOTE — Telephone Encounter (Signed)
Results given to patient, advised of rx for diflucan at pharmacy.

## 2018-09-17 ENCOUNTER — Telehealth: Payer: Self-pay | Admitting: Family

## 2018-09-17 NOTE — Telephone Encounter (Signed)
Copied from CRM [id number]. Topic: Appointment Scheduling - Scheduling Inquiry for Clinic >> Sep 16, 2018  2:48 PM [place], [place] wrote: Reason for CRM: Pt stated her throat is sore and she would like to schedule virtual visit. Pt requests call back.

## 2018-09-18 ENCOUNTER — Telehealth: Payer: Self-pay | Admitting: Family

## 2018-09-18 NOTE — Telephone Encounter (Signed)
Opened in error

## 2018-09-21 NOTE — Telephone Encounter (Signed)
Per patient she received a tele-visit from another physician before we called her and she was given a rx for penicillin she is feeling better. Advised patient to call us for a virtual visits if she has any other concerns.

## 2018-11-03 ENCOUNTER — Telehealth: Payer: Self-pay

## 2018-11-03 NOTE — Telephone Encounter (Signed)
Copied from CRM [id number]. Topic: General - Other >> Nov 03, 2018  7:48 AM Heather Welch wrote: Reason for CRM: Patient called to make an appt. To speak with the doctor regarding whether she should continue to see Dr. Peggyann Juba since finding out that she is pregnant.  Patient is not sure if she should seek an OB GYN doctor or if she can continue to see Dr. Peggyann Juba.  Please advise and call patient back to schedule and discuss.  CB# [phone]

## 2018-11-09 NOTE — Telephone Encounter (Signed)
I will need to see her first via virtual visit and then I can refer her to an OB/GYN.

## 2018-11-09 NOTE — Telephone Encounter (Signed)
Called patient but no answer lvm to call for virtual visit.

## 2018-11-11 NOTE — Telephone Encounter (Signed)
lvm again today patient to call us back if she still needs an appt

## 2020-06-28 IMAGING — US US RENAL
1 series · 14 of 25 positions shown · non-contrast
Comparison: None.

CLINICAL DATA: Right flank pain and microhematuria.

EXAM:
RENAL / URINARY TRACT ULTRASOUND COMPLETE

[Series 1: us renal · 0.22mm/px · 14 of 49 slices shown]
[im 1/49]
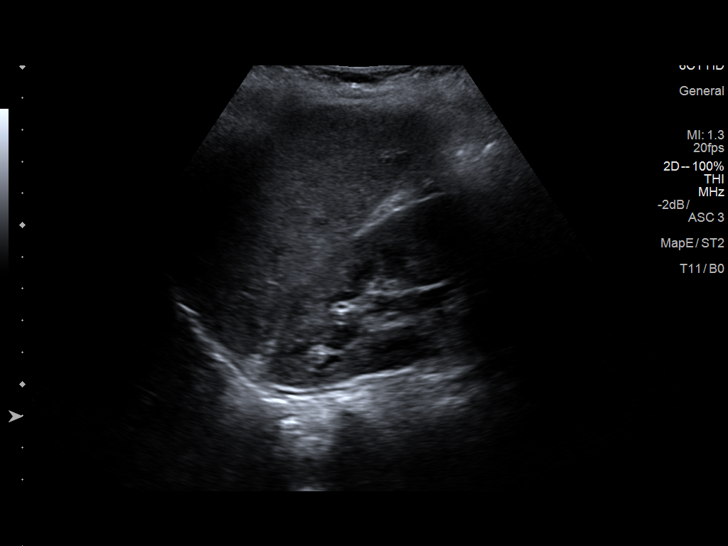
[im 5/49]
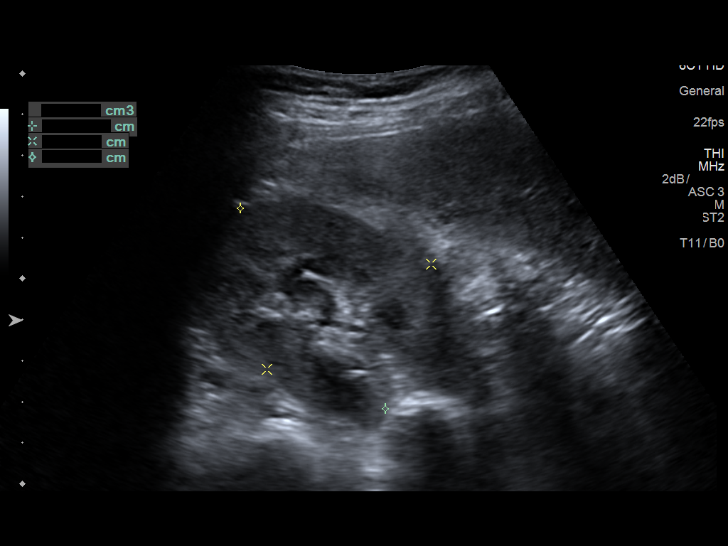
[im 9/49]
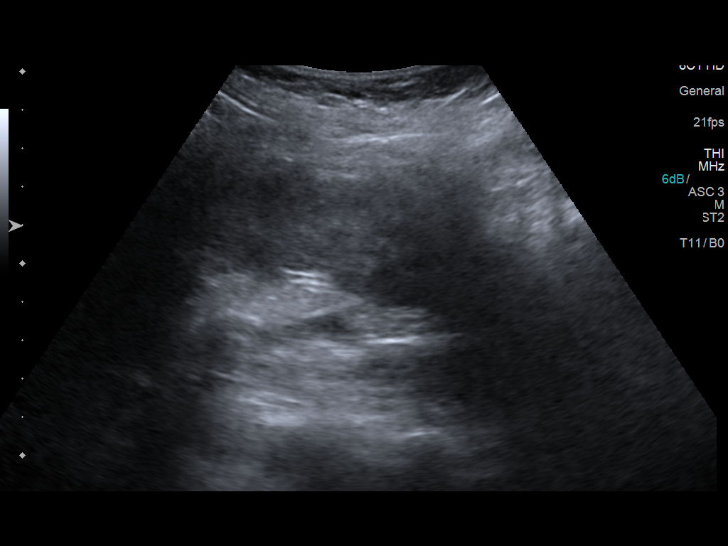
[im 13/49]
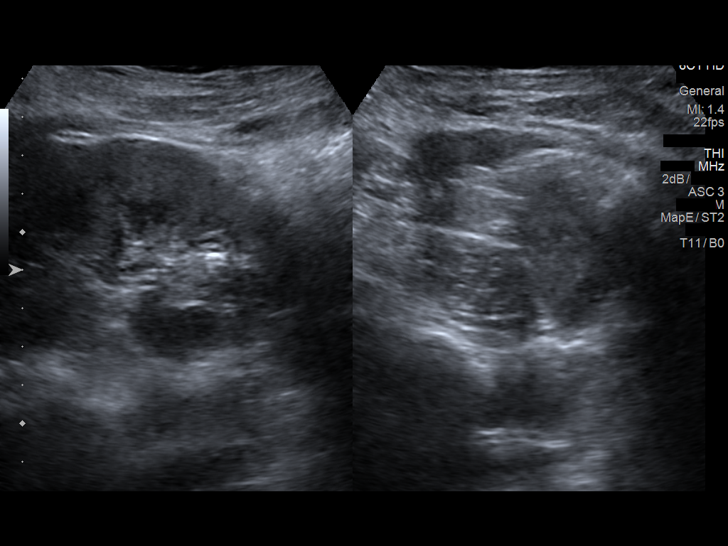
[im 17/49]
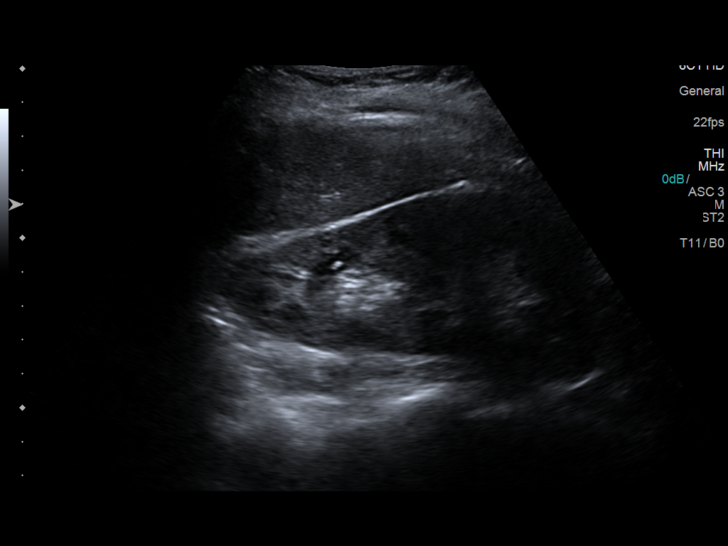
[im 19/49]
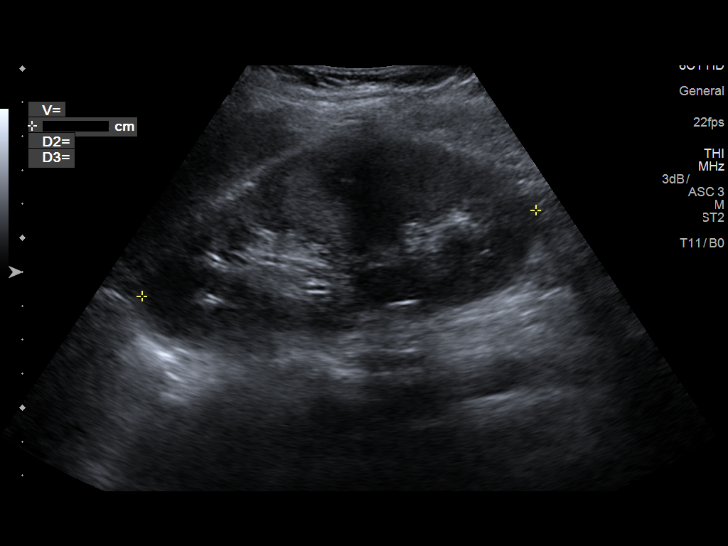
[im 23/49]
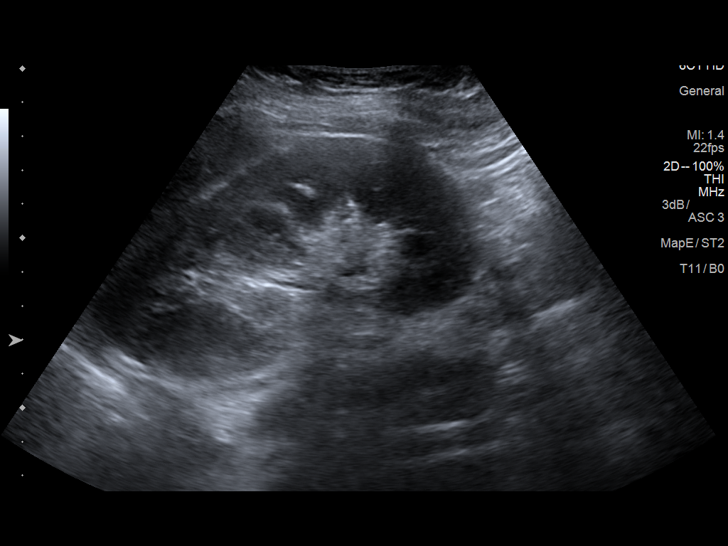
[im 27/49]
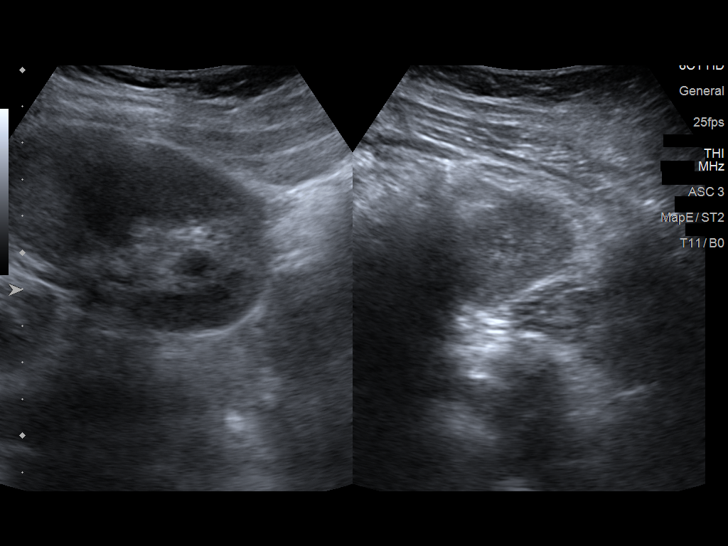
[im 31/49]
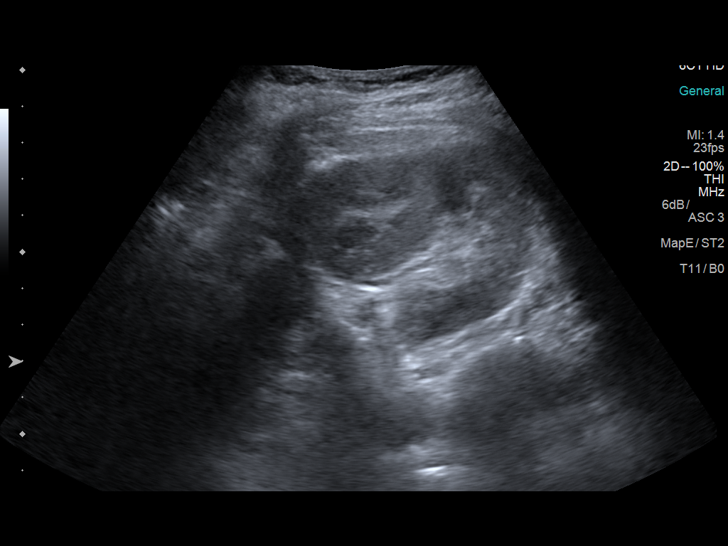
[im 33/49]
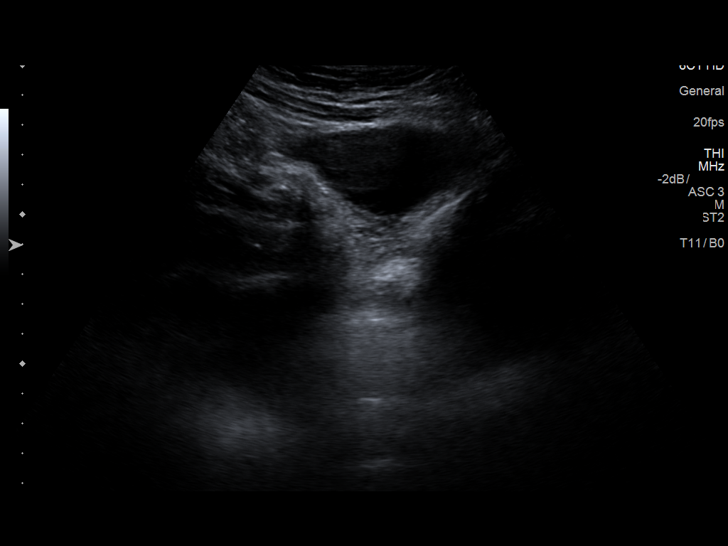
[im 37/49]
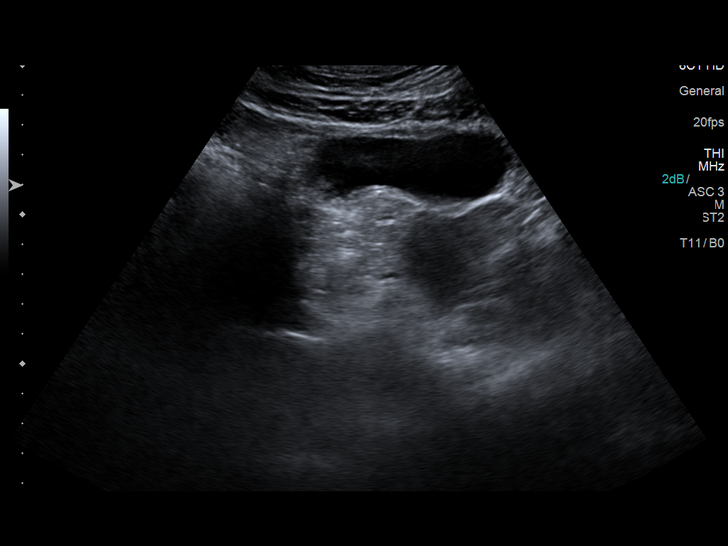
[im 41/49]
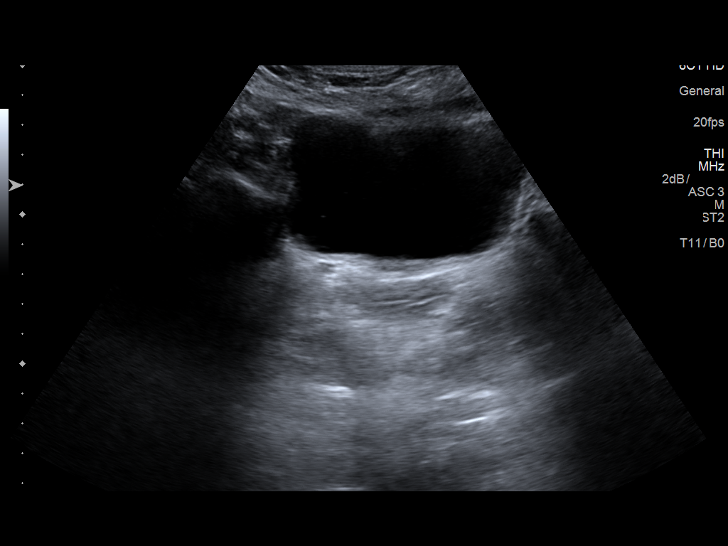
[im 45/49]
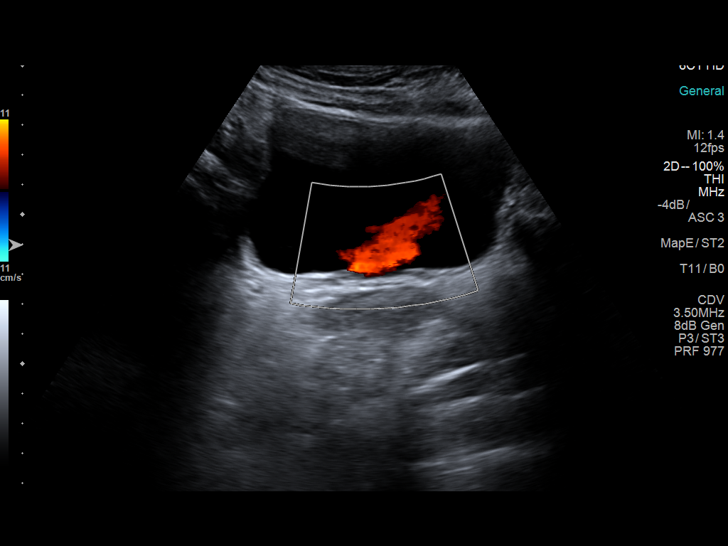
[im 49/49]
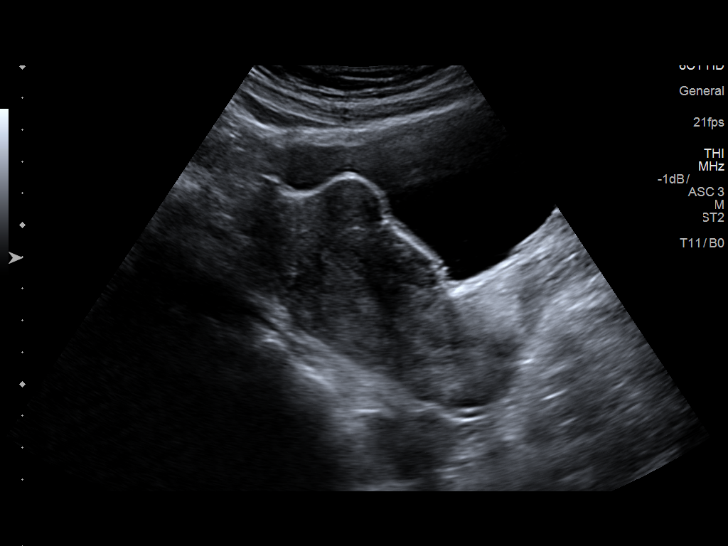

[14 of 25 positions shown; findings below may reference images not displayed]

FINDINGS: Right Kidney:

Renal measurements: 11.2 x 4.8 x 6.0 cm = volume: 169 mL .
Echogenicity within normal limits. No mass or hydronephrosis
visualized.

Left Kidney:

Renal measurements: 11.9 x 5.8 x 6.1 cm = volume: 220 mL.
Echogenicity within normal limits. No mass or hydronephrosis
visualized.

Bladder:

Appears normal for degree of bladder distention.

A rounded 1.8 cm mass was incidentally noted in the anterior uterus
suggesting a superficial intramural or subserosal fibroid.
IMPRESSION: 1. Unremarkable appearance of the kidneys.
2. 1.8 cm uterine mass likely representing a fibroid.

## 2020-07-22 ENCOUNTER — Other Ambulatory Visit: Payer: Self-pay

## 2020-07-22 ENCOUNTER — Telehealth (INDEPENDENT_AMBULATORY_CARE_PROVIDER_SITE_OTHER): Payer: Managed Care, Other (non HMO) | Admitting: Family

## 2020-07-22 DIAGNOSIS — H109 Unspecified conjunctivitis: Secondary | ICD-10-CM

## 2020-07-22 DIAGNOSIS — B349 Viral infection, unspecified: Secondary | ICD-10-CM | POA: Diagnosis not present

## 2020-07-22 MED ORDER — CIPROFLOXACIN HCL 0.3 % OP SOLN: OPHTHALMIC | 0 refills | Status: DC

## 2020-07-22 NOTE — Progress Notes (Signed)
Virtual Visit via Video Note  I connected with Heather Welch on 07/22/20 at 10:40 AM EST by a video enabled telemedicine application and verified that I am speaking with the correct person using two identifiers.  Location: Patient: home Provider: work   I discussed the limitations of evaluation and management by telemedicine and the availability of in person appointments. The patient expressed understanding and agreed to proceed. Only the patient and myself were present for today's video call.   History of Present Illness: Patient is a 35 year old female who presents today with chief complaint of redness in her left eye.  Symptoms began yesterday.  She denies any changes to her vision.  She is not a contact lens user.  She also reports that her son currently has COVID-19.  She reports she has had 2 Covid vaccines but has not yet had a booster.  On Thursday, February 10, the patient developed nausea, fatigue, runny nose, sneezing, coughing, and sore throat.  She is going later today to be tested for flu and Covid.    Observations/Objective:   Gen: Awake, alert, no acute distress Resp: Breathing is even and non-labored Eyes: Patient is noted to have injection left medial sclera with mild puffiness of her left eyelid. Psych: calm/pleasant demeanor Neuro: Alert and Oriented x 3, + facial symmetry, speech is clear.   Assessment and Plan:  Conjunctivitis-I suspect this is likely viral in setting of likely COVID-19 infection. However will plan empiric treatment with Ciloxan eyedrops to both eyes.  Viral infection-suspect COVID-19-I encouraged her to follow through with her testing for Covid and flu.  Assuming that she does have COVID-19, her quarantine should and tomorrow a.m. which is day 6 as long as her symptoms are improving and she is fever free without medication.  She understands that she will need to continue to mask for an additional 5 days.  She is currently out of work already as she  is caring for her ill son.  She is advised to call if symptoms worsen or if symptoms fail to improve.  Patient verbalizes understanding.   Follow Up Instructions:    I discussed the assessment and treatment plan with the patient. The patient was provided an opportunity to ask questions and all were answered. The patient agreed with the plan and demonstrated an understanding of the instructions.   The patient was advised to call back or seek an in-person evaluation if the symptoms worsen or if the condition fails to improve as anticipated.  Lemont Fillers, NP

## 2020-09-03 ENCOUNTER — Other Ambulatory Visit: Payer: Self-pay

## 2020-09-03 ENCOUNTER — Ambulatory Visit: Payer: Managed Care, Other (non HMO) | Admitting: Family

## 2020-09-03 VITALS — BP 124/80 | HR 101 | Temp 99.5°F | Resp 16 | Ht 60.0 in | Wt 156.0 lb

## 2020-09-03 DIAGNOSIS — Z3201 Encounter for pregnancy test, result positive: Secondary | ICD-10-CM

## 2020-09-03 DIAGNOSIS — N912 Amenorrhea, unspecified: Secondary | ICD-10-CM | POA: Diagnosis not present

## 2020-09-03 LAB — POCT URINE PREGNANCY: Preg Test, Ur: POSITIVE — AB

## 2020-09-03 NOTE — Patient Instructions (Signed)
Please add a prenatal vitamin once daily. You should be contacted about scheduling your appointment with the OB/GYN for your prenatal care.     Obstetrics: Normal and Problem Pregnancies (7th ed., pp. 102-121). [place], [place]: Elsevier."> Textbook of Family Medicine (9th ed., pp. [phone]). [place], [place]: [employer].">  First Trimester of Pregnancy  The first trimester of pregnancy starts on the first day of your last menstrual period until the end of week 12. This is months 1 through 3 of pregnancy. A week after a sperm fertilizes an egg, the egg will implant into the wall of the uterus and begin to develop into a baby. By the end of 12 weeks, all the baby's organs will be formed and the baby will be 2-3 inches in size. Body changes during your first trimester Your body goes through many changes during pregnancy. The changes vary and generally return to normal after your baby is born. Physical changes  You may gain or lose weight.  Your breasts may begin to grow larger and become tender. The tissue that surrounds your nipples (areola) may become darker.  Dark spots or blotches (chloasma or mask of pregnancy) may develop on your face.  You may have changes in your hair. These can include thickening or thinning of your hair or changes in texture. Health changes  You may feel nauseous, and you may vomit.  You may have heartburn.  You may develop headaches.  You may develop constipation.  Your gums may bleed and may be sensitive to brushing and flossing. Other changes  You may tire easily.  You may urinate more often.  Your menstrual periods will stop.  You may have a loss of appetite.  You may develop cravings for certain kinds of food.  You may have changes in your emotions from day to day.  You may have more vivid and strange dreams. Follow these instructions at home: Medicines  Follow your health care provider's instructions regarding medicine use. Specific  medicines may be either safe or unsafe to take during pregnancy. Do not take any medicines unless told to by your health care provider.  Take a prenatal vitamin that contains at least 600 micrograms (mcg) of folic acid. Eating and drinking  Eat a healthy diet that includes fresh fruits and vegetables, whole grains, good sources of protein such as meat, eggs, or tofu, and low-fat dairy products.  Avoid raw meat and unpasteurized juice, milk, and cheese. These carry germs that can harm you and your baby.  If you feel nauseous or you vomit: ? Eat 4 or 5 small meals a day instead of 3 large meals. ? Try eating a few soda crackers. ? Drink liquids between meals instead of during meals.  You may need to take these actions to prevent or treat constipation: ? Drink enough fluid to keep your urine pale yellow. ? Eat foods that are high in fiber, such as beans, whole grains, and fresh fruits and vegetables. ? Limit foods that are high in fat and processed sugars, such as fried or sweet foods. Activity  Exercise only as directed by your health care provider. Most people can continue their usual exercise routine during pregnancy. Try to exercise for 30 minutes at least 5 days a week.  Stop exercising if you develop pain or cramping in the lower abdomen or lower back.  Avoid exercising if it is very hot or humid or if you are at high altitude.  Avoid heavy lifting.  If you choose to,  you may have sex unless your health care provider tells you not to. Relieving pain and discomfort  Wear a good support bra to relieve breast tenderness.  Rest with your legs elevated if you have leg cramps or low back pain.  If you develop bulging veins (varicose veins) in your legs: ? Wear support hose as told by your health care provider. ? Elevate your feet for 15 minutes, 3-4 times a day. ? Limit salt in your diet. Safety  Wear your seat belt at all times when driving or riding in a car.  Talk with your  health care provider if someone is verbally or physically abusive to you.  Talk with your health care provider if you are feeling sad or have thoughts of hurting yourself. Lifestyle  Do not use hot tubs, steam rooms, or saunas.  Do not douche. Do not use tampons or scented sanitary pads.  Do not use herbal remedies, alcohol, illegal drugs, or medicines that are not approved by your health care provider. Chemicals in these products can harm your baby.  Do not use any products that contain nicotine or tobacco, such as cigarettes, e-cigarettes, and chewing tobacco. If you need help quitting, ask your health care provider.  Avoid cat litter boxes and soil used by cats. These carry germs that can cause birth defects in the baby and possibly loss of the unborn baby (fetus) by miscarriage or stillbirth. General instructions  During routine prenatal visits in the first trimester, your health care provider will do a physical exam, perform necessary tests, and ask you how things are going. Keep all follow-up visits. This is important.  Ask for help if you have counseling or nutritional needs during pregnancy. Your health care provider can offer advice or refer you to specialists for help with various needs.  Schedule a dentist appointment. At home, brush your teeth with a soft toothbrush. Floss gently.  Write down your questions. Take them to your prenatal visits. Where to find more information  American Pregnancy Association: americanpregnancy.org  [employer]st[employer] Gynecologists: https://www.todd-brady.net/  Office on [employer] Health: MightyReward.co.nz Contact a health care provider if you have:  Dizziness.  A fever.  Mild pelvic cramps, pelvic pressure, or nagging pain in the abdominal area.  Nausea, vomiting, or diarrhea that lasts for 24 hours or longer.  A bad-smelling vaginal discharge.  Pain when you urinate.  Known exposure to a  contagious illness, such as chickenpox, measles, Zika virus, HIV, or hepatitis. Get help right away if you have:  Spotting or bleeding from your vagina.  Severe abdominal cramping or pain.  Shortness of breath or chest pain.  Any kind of trauma, such as from a fall or a car crash.  New or increased pain, swelling, or redness in an arm or leg. Summary  The first trimester of pregnancy starts on the first day of your last menstrual period until the end of week 12 (months 1 through 3).  Eating 4 or 5 small meals a day rather than 3 large meals may help to relieve nausea and vomiting.  Do not use any products that contain nicotine or tobacco, such as cigarettes, e-cigarettes, and chewing tobacco. If you need help quitting, ask your health care provider.  Keep all follow-up visits. This is important. This information is not intended to replace advice given to you by your health care provider. Make sure you discuss any questions you have with your health care provider. Document Revised: 11/01/2019 Document Reviewed: 09/07/2019  Elsevier Patient Education  2021 Elsevier Inc.  

## 2020-09-03 NOTE — Progress Notes (Signed)
Subjective:    Patient ID: Heather Welch, female    DOB: 1985/12/15, 35 y.o.   MRN: [id number]  HPI   Patient is a 35 yr old female who presents today to discuss missed period. She reports that LMP was 05/20/20.  Her last intercourse was in December. States that she is prone to irregular menstrual cycles so she did not think she might be pregnant until recently. She took a home pregnancy test last night and it was "very positive." Notes that she has had some nipple tenderness and headache as well as some mild cramping.    Review of Systems    see HPI  Past Medical History:  Diagnosis Date  . History of chicken pox      Social History   Socioeconomic History  . Marital status: Single    Spouse name: Not on file  . Number of children: Not on file  . Years of education: Not on file  . Highest education level: Not on file  Occupational History  . Not on file  Tobacco Use  . Smoking status: Never Smoker  . Smokeless tobacco: Never Used  Substance and Sexual Activity  . Alcohol use: Yes    Comment: 1-2 drinks [place] every weekend  . Drug use: No  . Sexual activity: Yes    Birth control/protection: Pill  Other Topics Concern  . 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  Graduated from [employer]   Has a dog who she enjoys, enjoys shopping.     Social Determinants of Health   Financial Resource Strain: Not on file  Food Insecurity: Not on file  Transportation Needs: Not on file  Physical Activity: Not on file  Stress: Not on file  Social Connections: Not on file  Intimate Partner Violence: Not on file    Past Surgical History:  Procedure Laterality Date  . LEFT OOPHORECTOMY Left 2011   torsion / cyst    Family History  Problem Relation Age of Onset  . Carpal tunnel syndrome Mother   . Hypertension Mother   . Stroke Father   . Hypertension Father   .  Diabetes Father   . Stroke Maternal Grandfather   . Hypertension Maternal Grandfather   . Kidney disease Maternal Grandfather   . Cancer Paternal Grandmother        breast    No Known Allergies  No current outpatient medications on file prior to visit.   No current facility-administered medications on file prior to visit.    BP 124/80 (BP Location: Left Arm, Patient Position: Sitting, Cuff Size: Small)   Pulse (!) 101   Temp 99.5 F (37.5 C) (Oral)   Resp 16   Ht 5' (1.524 m)   Wt 156 lb (70.8 kg)   SpO2 99%   BMI 30.47 kg/m    Objective:   Physical Exam Constitutional:      Appearance: Normal appearance.  HENT:     Head: Normocephalic and atraumatic.  Eyes:     General: No scleral icterus. Musculoskeletal:        General: No swelling.  Neurological:     Mental Status: She is alert and oriented to person, place, and time.           Assessment & Plan:  Positive pregnancy test- estimate 10 weeks based on LMP.  Advised pt to add  a prenatal vitamin once daily, avoid alcohol, limit caffeine intake to 1 cup of coffee a day, stay well hydrated. For headache, advised OK to use tylenol sparingly.  I have referred her to OB/GYN for routine prenatal care.  20 minutes spent on today's visit. The majority of the time was spent counseling the patient on first trimester care.  This visit occurred during the SARS-CoV-2 public health emergency.  Safety protocols were in place, including screening questions prior to the visit, additional usage of staff PPE, and extensive cleaning of exam room while observing appropriate contact time as indicated for disinfecting solutions.

## 2020-10-02 ENCOUNTER — Ambulatory Visit (INDEPENDENT_AMBULATORY_CARE_PROVIDER_SITE_OTHER): Payer: Managed Care, Other (non HMO) | Admitting: Family Medicine

## 2020-10-02 ENCOUNTER — Other Ambulatory Visit: Payer: Self-pay

## 2020-10-02 ENCOUNTER — Other Ambulatory Visit (HOSPITAL_COMMUNITY)
Admission: RE | Admit: 2020-10-02 | Discharge: 2020-10-02 | Disposition: A | Discharge status: Home / Self Care | Payer: Managed Care, Other (non HMO) | Source: Ambulatory Visit | Attending: Family Medicine | Admitting: Family Medicine

## 2020-10-02 ENCOUNTER — Encounter: Payer: Self-pay | Admitting: Family Medicine

## 2020-10-02 VITALS — BP 118/67 | HR 101 | Wt 159.0 lb

## 2020-10-02 DIAGNOSIS — Z98891 History of uterine scar from previous surgery: Secondary | ICD-10-CM

## 2020-10-02 DIAGNOSIS — Z3A18 18 weeks gestation of pregnancy: Secondary | ICD-10-CM

## 2020-10-02 DIAGNOSIS — Z3480 Encounter for supervision of other normal pregnancy, unspecified trimester: Secondary | ICD-10-CM | POA: Insufficient documentation

## 2020-10-02 DIAGNOSIS — Z3482 Encounter for supervision of other normal pregnancy, second trimester: Secondary | ICD-10-CM | POA: Diagnosis present

## 2020-10-02 NOTE — Progress Notes (Signed)
Subjective:  Heather Welch is a G2P1001 [redacted]w[redacted]d by LMP being seen today for her first obstetrical visit.  Her obstetrical history is significant for prior cesarean delivery due to nonreassuring FHT. Marland Kitchen Patient does intend to breast feed. Pregnancy history fully reviewed.  Patient reports no complaints.  BP 118/67   Pulse (!) 101   Wt 159 lb (72.1 kg)   LMP 05/24/2020   BMI 31.05 kg/m   HISTORY: OB History  Gravida Para Term Preterm AB Living  2 1 1     1   SAB IAB Ectopic Multiple Live Births          1    # Outcome Date GA Lbr Len/2nd Weight Sex Delivery Anes PTL Lv  2 Current           1 Term 2021 [redacted]w[redacted]d   M CS-LTranv EPI N LIV    Past Medical History:  Diagnosis Date  . History of chicken pox     Past Surgical History:  Procedure Laterality Date  . LEFT OOPHORECTOMY Left 2011   torsion / cyst    Family History  Problem Relation Age of Onset  . Carpal tunnel syndrome Mother   . Hypertension Mother   . Stroke Father   . Hypertension Father   . Diabetes Father   . Stroke Maternal Grandfather   . Hypertension Maternal Grandfather   . Kidney disease Maternal Grandfather   . Cancer Paternal Grandmother        breast     Exam  BP 118/67   Pulse (!) 101   Wt 159 lb (72.1 kg)   LMP 05/24/2020   BMI 31.05 kg/m   Chaperone present during exam  CONSTITUTIONAL: Well-developed, well-nourished female in no acute distress.  HENT:  Normocephalic, atraumatic, External right and left ear normal. Oropharynx is clear and moist EYES: Conjunctivae and EOM are normal. Pupils are equal, round, and reactive to light. No scleral icterus.  NECK: Normal range of motion, supple, no masses.  Normal thyroid.  CARDIOVASCULAR: Normal heart rate noted, regular rhythm RESPIRATORY: Clear to auscultation bilaterally. Effort and breath sounds normal, no problems with respiration noted. BREASTS: Symmetric in size. No masses, skin changes, nipple drainage, or lymphadenopathy. ABDOMEN: Soft,  normal bowel sounds, no distention noted.  No tenderness, rebound or guarding.  PELVIC: Normal appearing external genitalia; normal appearing vaginal mucosa and cervix. No abnormal discharge noted. Normal uterine size, no other palpable masses, no uterine or adnexal tenderness. MUSCULOSKELETAL: Normal range of motion. No tenderness.  No cyanosis, clubbing, or edema.  2+ distal pulses. SKIN: Skin is warm and dry. No rash noted. Not diaphoretic. No erythema. No pallor. NEUROLOGIC: Alert and oriented to person, place, and time. Normal reflexes, muscle tone coordination. No cranial nerve deficit noted. PSYCHIATRIC: Normal mood and affect. Normal behavior. Normal judgment and thought content.    Assessment:    Pregnancy: G2P1001 Patient Active Problem List   Diagnosis Date Noted  . Supervision of other normal pregnancy, antepartum 10/02/2020  . History of C-section 10/02/2020  . Anxiety state 11/11/2015  . Headache 11/11/2015      Plan:   1. Supervision of other normal pregnancy, antepartum Initial labs obtained Continue prenatal vitamins Reviewed n/v relief measures and warning s/s to report Reviewed recommended weight gain based on pre-gravid BMI Encouraged well-balanced diet Genetic & carrier screening discussed: requests Panorama,  Ultrasound discussed; fetal survey: requested CCNC completed> form faxed if has or is planning to apply for medicaid The nature of [facility] [employer] with multiple MDs and other Advanced Practice Providers was explained to patient; also emphasized that fellows, residents, and students are part of our team.  - Hemoglobin A1c - CBC/D/Plt+RPR+Rh+ABO+Rub Ab... - Hemoglobpathy+Fer w/A Thal Rfx - [facility] AMB BABYSCRIPTS OPT IN - Culture, OB Urine - [place] MFM OB DETAIL +14 WK; Future - Cytology - PAP( [place])  2. History of C-section Thinking about TOLAC.  - Hemoglobin A1c - CBC/D/Plt+RPR+Rh+ABO+Rub Ab... - Hemoglobpathy+Fer w/A  Thal Rfx - [facility] AMB BABYSCRIPTS OPT IN - Culture, OB Urine - [place] MFM OB DETAIL +14 WK; Future - Cytology - PAP( [place])  3. [redacted] weeks gestation of pregnancy  - Hemoglobin A1c - CBC/D/Plt+RPR+Rh+ABO+Rub Ab... - Hemoglobpathy+Fer w/A Thal Rfx - CH[facility] AMB BABYSCRIPTS OPT IN - Culture, OB Urine - [place] MFM OB DETAIL +14 WK; Future - Cytology - PAP( )    Problem list reviewed and updated. 75% of visit spent on counseling and coordination of care.     Levie Heritage 10/02/2020

## 2020-10-04 LAB — CBC/D/PLT+RPR+RH+ABO+RUB AB...
Antibody Screen: NEGATIVE
Basophils Absolute: 0 10*3/uL (ref 0.0–0.2)
Basos: 0 %
EOS (ABSOLUTE): 0.1 10*3/uL (ref 0.0–0.4)
Eos: 1 %
HCV Ab: <0.1 s/co ratio (ref 0.0–0.9)
HIV Screen 4th Generation wRfx: NONREACTIVE
Hematocrit: 31.3 % — ABNORMAL LOW (ref 34.0–46.6)
Hemoglobin: 10.5 g/dL — ABNORMAL LOW (ref 11.1–15.9)
Hepatitis B Surface Ag: NEGATIVE
Immature Grans (Abs): 0 10*3/uL (ref 0.0–0.1)
Immature Granulocytes: 1 %
Lymphocytes Absolute: 1.5 10*3/uL (ref 0.7–3.1)
Lymphs: 23 %
MCH: 27.8 pg (ref 26.6–33.0)
MCHC: 33.5 g/dL (ref 31.5–35.7)
MCV: 83 fL (ref 79–97)
Monocytes Absolute: 0.4 10*3/uL (ref 0.1–0.9)
Monocytes: 7 %
Neutrophils Absolute: 4.6 10*3/uL (ref 1.4–7.0)
Neutrophils: 68 %
Platelets: 224 10*3/uL (ref 150–450)
RBC: 3.78 x10E6/uL (ref 3.77–5.28)
RDW: 15.1 % (ref 11.7–15.4)
RPR Ser Ql: NONREACTIVE
Rh Factor: POSITIVE
Rubella Antibodies, IGG: 9.39 index (ref >0.99)
WBC: 6.7 10*3/uL (ref 3.4–10.8)

## 2020-10-04 LAB — CYTOLOGY - PAP
Chlamydia: NEGATIVE
Comment: NEGATIVE
Comment: NEGATIVE
Comment: NORMAL
Diagnosis: NEGATIVE
High risk HPV: NEGATIVE
Neisseria Gonorrhea: NEGATIVE

## 2020-10-04 LAB — CULTURE, OB URINE

## 2020-10-04 LAB — HCV INTERPRETATION

## 2020-10-04 LAB — URINE CULTURE, OB REFLEX

## 2020-10-04 LAB — HEMOGLOBPATHY+FER W/A THAL RFX
Ferritin: 10 ng/mL — ABNORMAL LOW (ref 15–150)
Hgb A2: 2.5 % (ref 1.8–3.2)
Hgb A: 97.5 % (ref 96.4–98.8)
Hgb F: 0 % (ref 0.0–2.0)
Hgb S: 0 %

## 2020-10-04 LAB — HEMOGLOBIN A1C
Est. average glucose Bld gHb Est-mCnc: 114 mg/dL
Hgb A1c MFr Bld: 5.6 % (ref 4.8–5.6)

## 2020-10-04 MED ORDER — IRON (FERROUS GLUCONATE) 256 (28 FE) MG PO TABS: 1.0000 | ORAL_TABLET | ORAL | 2 refills | Status: DC

## 2020-10-04 NOTE — Addendum Note (Signed)
Addended by: Levie Heritage on: 10/04/2020 11:52 AM   Modules accepted: Orders

## 2020-10-18 ENCOUNTER — Encounter: Payer: Self-pay | Admitting: *Deleted

## 2020-10-18 ENCOUNTER — Ambulatory Visit: Payer: Managed Care, Other (non HMO) | Attending: Family Medicine

## 2020-10-18 ENCOUNTER — Other Ambulatory Visit: Payer: Self-pay

## 2020-10-18 ENCOUNTER — Other Ambulatory Visit: Payer: Self-pay | Admitting: *Deleted

## 2020-10-18 ENCOUNTER — Ambulatory Visit: Payer: Managed Care, Other (non HMO) | Admitting: *Deleted

## 2020-10-18 DIAGNOSIS — Z98891 History of uterine scar from previous surgery: Secondary | ICD-10-CM | POA: Diagnosis present

## 2020-10-18 DIAGNOSIS — O34219 Maternal care for unspecified type scar from previous cesarean delivery: Secondary | ICD-10-CM

## 2020-10-18 DIAGNOSIS — Z3480 Encounter for supervision of other normal pregnancy, unspecified trimester: Secondary | ICD-10-CM

## 2020-10-18 DIAGNOSIS — Z3A18 18 weeks gestation of pregnancy: Secondary | ICD-10-CM | POA: Diagnosis present

## 2020-10-30 ENCOUNTER — Other Ambulatory Visit: Payer: Self-pay

## 2020-10-30 ENCOUNTER — Encounter: Payer: Self-pay | Admitting: Family Medicine

## 2020-10-30 ENCOUNTER — Ambulatory Visit (INDEPENDENT_AMBULATORY_CARE_PROVIDER_SITE_OTHER): Payer: Managed Care, Other (non HMO) | Admitting: Family Medicine

## 2020-10-30 VITALS — BP 115/68 | HR 93 | Wt 161.0 lb

## 2020-10-30 DIAGNOSIS — Z3A24 24 weeks gestation of pregnancy: Secondary | ICD-10-CM

## 2020-10-30 DIAGNOSIS — Z98891 History of uterine scar from previous surgery: Secondary | ICD-10-CM

## 2020-10-30 DIAGNOSIS — Z3480 Encounter for supervision of other normal pregnancy, unspecified trimester: Secondary | ICD-10-CM

## 2020-10-30 NOTE — Progress Notes (Signed)
   Subjective:  Heather Welch is a 35 y.o. G2P1001 at [redacted]w[redacted]d being seen today for ongoing prenatal care.  She is currently monitored for the following issues for this low-risk pregnancy and has Anxiety state; Headache; Supervision of other normal pregnancy, antepartum; and History of C-section on their problem list.  Patient reports no complaints.  Contractions: Not present. Vag. Bleeding: None.  Movement: Present. Denies leaking of fluid.   The following portions of the patient's history were reviewed and updated as appropriate: allergies, current medications, past family history, past medical history, past social history, past surgical history and problem list. Problem list updated.  Objective:   Vitals:   10/30/20 1525  BP: 115/68  Pulse: 93  Weight: 161 lb (73 kg)    Fetal Status: Fetal Heart Rate (bpm): 147   Movement: Present     General:  Alert, oriented and cooperative. Patient is in no acute distress.  Skin: Skin is warm and dry. No rash noted.   Cardiovascular: Normal heart rate noted  Respiratory: Normal respiratory effort, no problems with respiration noted  Abdomen: Soft, gravid, appropriate for gestational age. Pain/Pressure: Present     Pelvic: Vag. Bleeding: None     Cervical exam deferred        Extremities: Normal range of motion.  Edema: None  Mental Status: Normal mood and affect. Normal behavior. Normal judgment and thought content.   Urinalysis:      Assessment and Plan:  Pregnancy: G2P1001 at [redacted]w[redacted]d  1. [redacted] weeks gestation of pregnancy   2. Supervision of other normal pregnancy, antepartum BP and FHR normal Discussed 28 wk labs next visit  3. History of C-section Considering TOLAC  Preterm labor symptoms and general obstetric precautions including but not limited to vaginal bleeding, contractions, leaking of fluid and fetal movement were reviewed in detail with the patient. Please refer to After Visit Summary for other counseling recommendations.   Return in 4 weeks (on 11/27/2020) for ob visit, 28 wk labs.   Venora Maples, MD

## 2020-10-30 NOTE — Patient Instructions (Signed)
 Contraception Choices Contraception, also called birth control, refers to methods or devices that prevent pregnancy. Hormonal methods Contraceptive implant A contraceptive implant is a thin, plastic tube that contains a hormone that prevents pregnancy. It is different from an intrauterine device (IUD). It is inserted into the upper part of the arm by a health care provider. Implants can be effective for up to 3 years. Progestin-only injections Progestin-only injections are injections of progestin, a synthetic form of the hormone progesterone. They are given every 3 months by a health care provider. Birth control pills Birth control pills are pills that contain hormones that prevent pregnancy. They must be taken once a day, preferably at the same time each day. A prescription is needed to use this method of contraception. Birth control patch The birth control patch contains hormones that prevent pregnancy. It is placed on the skin and must be changed once a week for three weeks and removed on the fourth week. A prescription is needed to use this method of contraception. Vaginal ring A vaginal ring contains hormones that prevent pregnancy. It is placed in the vagina for three weeks and removed on the fourth week. After that, the process is repeated with a new ring. A prescription is needed to use this method of contraception. Emergency contraceptive Emergency contraceptives prevent pregnancy after unprotected sex. They come in pill form and can be taken up to 5 days after sex. They work best the sooner they are taken after having sex. Most emergency contraceptives are available without a prescription. This method should not be used as your only form of birth control.   Barrier methods Female condom A female condom is a thin sheath that is worn over the penis during sex. Condoms keep sperm from going inside a woman's body. They can be used with a sperm-killing substance (spermicide) to increase their  effectiveness. They should be thrown away after one use. Female condom A female condom is a soft, loose-fitting sheath that is put into the vagina before sex. The condom keeps sperm from going inside a woman's body. They should be thrown away after one use. Diaphragm A diaphragm is a soft, dome-shaped barrier. It is inserted into the vagina before sex, along with a spermicide. The diaphragm blocks sperm from entering the uterus, and the spermicide kills sperm. A diaphragm should be left in the vagina for 6-8 hours after sex and removed within 24 hours. A diaphragm is prescribed and fitted by a health care provider. A diaphragm should be replaced every 1-2 years, after giving birth, after gaining more than 15 lb (6.8 kg), and after pelvic surgery. Cervical cap A cervical cap is a round, soft latex or plastic cup that fits over the cervix. It is inserted into the vagina before sex, along with spermicide. It blocks sperm from entering the uterus. The cap should be left in place for 6-8 hours after sex and removed within 48 hours. A cervical cap must be prescribed and fitted by a health care provider. It should be replaced every 2 years. Sponge A sponge is a soft, circular piece of polyurethane foam with spermicide in it. The sponge helps block sperm from entering the uterus, and the spermicide kills sperm. To use it, you make it wet and then insert it into the vagina. It should be inserted before sex, left in for at least 6 hours after sex, and removed and thrown away within 30 hours. Spermicides Spermicides are chemicals that kill or block sperm from entering the   cervix and uterus. They can come as a cream, jelly, suppository, foam, or tablet. A spermicide should be inserted into the vagina with an applicator at least 10-15 minutes before sex to allow time for it to work. The process must be repeated every time you have sex. Spermicides do not require a prescription.   Intrauterine  contraception Intrauterine device (IUD) An IUD is a T-shaped device that is put in a woman's uterus. There are two types:  Hormone IUD.This type contains progestin, a synthetic form of the hormone progesterone. This type can stay in place for 3-5 years.  Copper IUD.This type is wrapped in copper wire. It can stay in place for 10 years. Permanent methods of contraception Female tubal ligation In this method, a woman's fallopian tubes are sealed, tied, or blocked during surgery to prevent eggs from traveling to the uterus. Hysteroscopic sterilization In this method, a small, flexible insert is placed into each fallopian tube. The inserts cause scar tissue to form in the fallopian tubes and block them, so sperm cannot reach an egg. The procedure takes about 3 months to be effective. Another form of birth control must be used during those 3 months. Female sterilization This is a procedure to tie off the tubes that carry sperm (vasectomy). After the procedure, the man can still ejaculate fluid (semen). Another form of birth control must be used for 3 months after the procedure. Natural planning methods Natural family planning In this method, a couple does not have sex on days when the woman could become pregnant. Calendar method In this method, the woman keeps track of the length of each menstrual cycle, identifies the days when pregnancy can happen, and does not have sex on those days. Ovulation method In this method, a couple avoids sex during ovulation. Symptothermal method This method involves not having sex during ovulation. The woman typically checks for ovulation by watching changes in her temperature and in the consistency of cervical mucus. Post-ovulation method In this method, a couple waits to have sex until after ovulation. Where to find more information  Centers for Disease Control and Prevention: www.cdc.gov Summary  Contraception, also called birth control, refers to methods or  devices that prevent pregnancy.  Hormonal methods of contraception include implants, injections, pills, patches, vaginal rings, and emergency contraceptives.  Barrier methods of contraception can include female condoms, female condoms, diaphragms, cervical caps, sponges, and spermicides.  There are two types of IUDs (intrauterine devices). An IUD can be put in a woman's uterus to prevent pregnancy for 3-5 years.  Permanent sterilization can be done through a procedure for males and females. Natural family planning methods involve nothaving sex on days when the woman could become pregnant. This information is not intended to replace advice given to you by your health care provider. Make sure you discuss any questions you have with your health care provider. Document Revised: 10/30/2019 Document Reviewed: 10/30/2019 Elsevier Patient Education  2021 [employer] Inc.   Breastfeeding  Choosing to breastfeed is one of the best decisions you can make for yourself and your baby. A change in hormones during pregnancy causes your breasts to make breast milk in your milk-producing glands. Hormones prevent breast milk from being released before your baby is born. They also prompt milk flow after birth. Once breastfeeding has begun, thoughts of your baby, as well as his or her sucking or crying, can stimulate the release of milk from your milk-producing glands. Benefits of breastfeeding Research shows that breastfeeding offers many health benefits   for infants and mothers. It also offers a cost-free and convenient way to feed your baby. For your baby  Your first milk (colostrum) helps your baby's digestive system to function better.  Special cells in your milk (antibodies) help your baby to fight off infections.  Breastfed babies are less likely to develop asthma, allergies, obesity, or type 2 diabetes. They are also at lower risk for sudden infant death syndrome (SIDS).  Nutrients in breast milk are better  able to meet your baby's needs compared to infant formula.  Breast milk improves your baby's brain development. For you  Breastfeeding helps to create a very special bond between you and your baby.  Breastfeeding is convenient. Breast milk costs nothing and is always available at the correct temperature.  Breastfeeding helps to burn calories. It helps you to lose the weight that you gained during pregnancy.  Breastfeeding makes your uterus return faster to its size before pregnancy. It also slows bleeding (lochia) after you give birth.  Breastfeeding helps to lower your risk of developing type 2 diabetes, osteoporosis, rheumatoid arthritis, cardiovascular disease, and breast, ovarian, uterine, and endometrial cancer later in life. Breastfeeding basics Starting breastfeeding  Find a comfortable place to sit or lie down, with your neck and back well-supported.  Place a pillow or a rolled-up blanket under your baby to bring him or her to the level of your breast (if you are seated). Nursing pillows are specially designed to help support your arms and your baby while you breastfeed.  Make sure that your baby's tummy (abdomen) is facing your abdomen.  Gently massage your breast. With your fingertips, massage from the outer edges of your breast inward toward the nipple. This encourages milk flow. If your milk flows slowly, you may need to continue this action during the feeding.  Support your breast with 4 fingers underneath and your thumb above your nipple (make the letter "C" with your hand). Make sure your fingers are well away from your nipple and your baby's mouth.  Stroke your baby's lips gently with your finger or nipple.  When your baby's mouth is open wide enough, quickly bring your baby to your breast, placing your entire nipple and as much of the areola as possible into your baby's mouth. The areola is the colored area around your nipple. ? More areola should be visible above your  baby's upper lip than below the lower lip. ? Your baby's lips should be opened and extended outward (flanged) to ensure an adequate, comfortable latch. ? Your baby's tongue should be between his or her lower gum and your breast.  Make sure that your baby's mouth is correctly positioned around your nipple (latched). Your baby's lips should create a seal on your breast and be turned out (everted).  It is common for your baby to suck about 2-3 minutes in order to start the flow of breast milk. Latching Teaching your baby how to latch onto your breast properly is very important. An improper latch can cause nipple pain, decreased milk supply, and poor weight gain in your baby. Also, if your baby is not latched onto your nipple properly, he or she may swallow some air during feeding. This can make your baby fussy. Burping your baby when you switch breasts during the feeding can help to get rid of the air. However, teaching your baby to latch on properly is still the best way to prevent fussiness from swallowing air while breastfeeding. Signs that your baby has successfully latched onto   your nipple  Silent tugging or silent sucking, without causing you pain. Infant's lips should be extended outward (flanged).  Swallowing heard between every 3-4 sucks once your milk has started to flow (after your let-down milk reflex occurs).  Muscle movement above and in front of his or her ears while sucking. Signs that your baby has not successfully latched onto your nipple  Sucking sounds or smacking sounds from your baby while breastfeeding.  Nipple pain. If you think your baby has not latched on correctly, slip your finger into the corner of your baby's mouth to break the suction and place it between your baby's gums. Attempt to start breastfeeding again. Signs of successful breastfeeding Signs from your baby  Your baby will gradually decrease the number of sucks or will completely stop sucking.  Your baby  will fall asleep.  Your baby's body will relax.  Your baby will retain a small amount of milk in his or her mouth.  Your baby will let go of your breast by himself or herself. Signs from you  Breasts that have increased in firmness, weight, and size 1-3 hours after feeding.  Breasts that are softer immediately after breastfeeding.  Increased milk volume, as well as a change in milk consistency and color by the fifth day of breastfeeding.  Nipples that are not sore, cracked, or bleeding. Signs that your baby is getting enough milk  Wetting at least 1-2 diapers during the first 24 hours after birth.  Wetting at least 5-6 diapers every 24 hours for the first week after birth. The urine should be clear or pale yellow by the age of 5 days.  Wetting 6-8 diapers every 24 hours as your baby continues to grow and develop.  At least 3 stools in a 24-hour period by the age of 5 days. The stool should be soft and yellow.  At least 3 stools in a 24-hour period by the age of 7 days. The stool should be seedy and yellow.  No loss of weight greater than 10% of birth weight during the first 3 days of life.  Average weight gain of 4-7 oz (113-198 g) per week after the age of 4 days.  Consistent daily weight gain by the age of 5 days, without weight loss after the age of 2 weeks. After a feeding, your baby may spit up a small amount of milk. This is normal. Breastfeeding frequency and duration Frequent feeding will help you make more milk and can prevent sore nipples and extremely full breasts (breast engorgement). Breastfeed when you feel the need to reduce the fullness of your breasts or when your baby shows signs of hunger. This is called "breastfeeding on demand." Signs that your baby is hungry include:  Increased alertness, activity, or restlessness.  Movement of the head from side to side.  Opening of the mouth when the corner of the mouth or cheek is stroked (rooting).  Increased  sucking sounds, smacking lips, cooing, sighing, or squeaking.  Hand-to-mouth movements and sucking on fingers or hands.  Fussing or crying. Avoid introducing a pacifier to your baby in the first 4-6 weeks after your baby is born. After this time, you may choose to use a pacifier. Research has shown that pacifier use during the first year of a baby's life decreases the risk of sudden infant death syndrome (SIDS). Allow your baby to feed on each breast as long as he or she wants. When your baby unlatches or falls asleep while feeding from the   first breast, offer the second breast. Because newborns are often sleepy in the first few weeks of life, you may need to awaken your baby to get him or her to feed. Breastfeeding times will vary from baby to baby. However, the following rules can serve as a guide to help you make sure that your baby is properly fed:  Newborns (babies 4 weeks of age or younger) may breastfeed every 1-3 hours.  Newborns should not go without breastfeeding for longer than 3 hours during the day or 5 hours during the night.  You should breastfeed your baby a minimum of 8 times in a 24-hour period. Breast milk pumping Pumping and storing breast milk allows you to make sure that your baby is exclusively fed your breast milk, even at times when you are unable to breastfeed. This is especially important if you go back to work while you are still breastfeeding, or if you are not able to be present during feedings. Your lactation consultant can help you find a method of pumping that works best for you and give you guidelines about how long it is safe to store breast milk.      Caring for your breasts while you breastfeed Nipples can become dry, cracked, and sore while breastfeeding. The following recommendations can help keep your breasts moisturized and healthy:  Avoid using soap on your nipples.  Wear a supportive bra designed especially for nursing. Avoid wearing underwire-style  bras or extremely tight bras (sports bras).  Air-dry your nipples for 3-4 minutes after each feeding.  Use only cotton bra pads to absorb leaked breast milk. Leaking of breast milk between feedings is normal.  Use lanolin on your nipples after breastfeeding. Lanolin helps to maintain your skin's normal moisture barrier. Pure lanolin is not harmful (not toxic) to your baby. You may also hand express a few drops of breast milk and gently massage that milk into your nipples and allow the milk to air-dry. In the first few weeks after giving birth, some women experience breast engorgement. Engorgement can make your breasts feel heavy, warm, and tender to the touch. Engorgement peaks within 3-5 days after you give birth. The following recommendations can help to ease engorgement:  Completely empty your breasts while breastfeeding or pumping. You may want to start by applying warm, moist heat (in the shower or with warm, water-soaked hand towels) just before feeding or pumping. This increases circulation and helps the milk flow. If your baby does not completely empty your breasts while breastfeeding, pump any extra milk after he or she is finished.  Apply ice packs to your breasts immediately after breastfeeding or pumping, unless this is too uncomfortable for you. To do this: ? Put ice in a plastic bag. ? Place a towel between your skin and the bag. ? Leave the ice on for 20 minutes, 2-3 times a day.  Make sure that your baby is latched on and positioned properly while breastfeeding. If engorgement persists after 48 hours of following these recommendations, contact your health care provider or a lactation consultant. Overall health care recommendations while breastfeeding  Eat 3 healthy meals and 3 snacks every day. Well-nourished mothers who are breastfeeding need an additional 450-500 calories a day. You can meet this requirement by increasing the amount of a balanced diet that you eat.  Drink  enough water to keep your urine pale yellow or clear.  Rest often, relax, and continue to take your prenatal vitamins to prevent fatigue, stress, and low   vitamin and mineral levels in your body (nutrient deficiencies).  Do not use any products that contain nicotine or tobacco, such as cigarettes and e-cigarettes. Your baby may be harmed by chemicals from cigarettes that pass into breast milk and exposure to secondhand smoke. If you need help quitting, ask your health care provider.  Avoid alcohol.  Do not use illegal drugs or marijuana.  Talk with your health care provider before taking any medicines. These include over-the-counter and prescription medicines as well as vitamins and herbal supplements. Some medicines that may be harmful to your baby can pass through breast milk.  It is possible to become pregnant while breastfeeding. If birth control is desired, ask your health care provider about options that will be safe while breastfeeding your baby. Where to find more information: [employer]: www.llli.org Contact a health care provider if:  You feel like you want to stop breastfeeding or have become frustrated with breastfeeding.  Your nipples are cracked or bleeding.  Your breasts are red, tender, or warm.  You have: ? Painful breasts or nipples. ? A swollen area on either breast. ? A fever or chills. ? Nausea or vomiting. ? Drainage other than breast milk from your nipples.  Your breasts do not become full before feedings by the fifth day after you give birth.  You feel sad and depressed.  Your baby is: ? Too sleepy to eat well. ? Having trouble sleeping. ? More than 1 week old and wetting fewer than 6 diapers in a 24-hour period. ? Not gaining weight by 5 days of age.  Your baby has fewer than 3 stools in a 24-hour period.  Your baby's skin or the white parts of his or her eyes become yellow. Get help right away if:  Your baby is overly tired  (lethargic) and does not want to wake up and feed.  Your baby develops an unexplained fever. Summary  Breastfeeding offers many health benefits for infant and mothers.  Try to breastfeed your infant when he or she shows early signs of hunger.  Gently tickle or stroke your baby's lips with your finger or nipple to allow the baby to open his or her mouth. Bring the baby to your breast. Make sure that much of the areola is in your baby's mouth. Offer one side and burp the baby before you offer the other side.  Talk with your health care provider or lactation consultant if you have questions or you face problems as you breastfeed. This information is not intended to replace advice given to you by your health care provider. Make sure you discuss any questions you have with your health care provider. Document Revised: 08/19/2017 Document Reviewed: 06/26/2016 Elsevier Patient Education  2021 Elsevier Inc.  

## 2020-11-18 ENCOUNTER — Ambulatory Visit: Payer: Managed Care, Other (non HMO) | Attending: Obstetrics and Gynecology

## 2020-11-18 ENCOUNTER — Ambulatory Visit: Payer: Managed Care, Other (non HMO) | Admitting: *Deleted

## 2020-11-18 ENCOUNTER — Encounter: Payer: Self-pay | Admitting: *Deleted

## 2020-11-18 ENCOUNTER — Other Ambulatory Visit: Payer: Self-pay

## 2020-11-18 VITALS — BP 126/71 | HR 89

## 2020-11-18 DIAGNOSIS — Z3480 Encounter for supervision of other normal pregnancy, unspecified trimester: Secondary | ICD-10-CM | POA: Insufficient documentation

## 2020-11-18 DIAGNOSIS — Z362 Encounter for other antenatal screening follow-up: Secondary | ICD-10-CM | POA: Diagnosis not present

## 2020-11-18 DIAGNOSIS — Z98891 History of uterine scar from previous surgery: Secondary | ICD-10-CM

## 2020-11-18 DIAGNOSIS — O99342 Other mental disorders complicating pregnancy, second trimester: Secondary | ICD-10-CM | POA: Diagnosis not present

## 2020-11-18 DIAGNOSIS — Z90721 Acquired absence of ovaries, unilateral: Secondary | ICD-10-CM

## 2020-11-18 DIAGNOSIS — F419 Anxiety disorder, unspecified: Secondary | ICD-10-CM

## 2020-11-18 DIAGNOSIS — Z363 Encounter for antenatal screening for malformations: Secondary | ICD-10-CM

## 2020-11-18 DIAGNOSIS — Z3A27 27 weeks gestation of pregnancy: Secondary | ICD-10-CM

## 2020-11-18 DIAGNOSIS — O34219 Maternal care for unspecified type scar from previous cesarean delivery: Secondary | ICD-10-CM | POA: Insufficient documentation

## 2020-11-29 ENCOUNTER — Other Ambulatory Visit: Payer: Self-pay

## 2020-11-29 ENCOUNTER — Ambulatory Visit (INDEPENDENT_AMBULATORY_CARE_PROVIDER_SITE_OTHER): Payer: Managed Care, Other (non HMO) | Admitting: Family Medicine

## 2020-11-29 VITALS — BP 114/74 | HR 91 | Wt 162.0 lb

## 2020-11-29 DIAGNOSIS — Z3A28 28 weeks gestation of pregnancy: Secondary | ICD-10-CM

## 2020-11-29 DIAGNOSIS — Z3480 Encounter for supervision of other normal pregnancy, unspecified trimester: Secondary | ICD-10-CM

## 2020-11-29 DIAGNOSIS — Z98891 History of uterine scar from previous surgery: Secondary | ICD-10-CM

## 2020-11-29 DIAGNOSIS — Z23 Encounter for immunization: Secondary | ICD-10-CM

## 2020-11-29 NOTE — Progress Notes (Signed)
   PRENATAL VISIT NOTE  Subjective:  Heather Welch is a 35 y.o. G2P1001 at [redacted]w[redacted]d being seen today for ongoing prenatal care.  She is currently monitored for the following issues for this low-risk pregnancy and has Anxiety state; Headache; Supervision of other normal pregnancy, antepartum; and History of C-section on their problem list.  Patient reports no complaints.  Contractions: Not present. Vag. Bleeding: None.  Movement: Present. Denies leaking of fluid.   The following portions of the patient's history were reviewed and updated as appropriate: allergies, current medications, past family history, past medical history, past social history, past surgical history and problem list.   Objective:   Vitals:   11/29/20 0819  BP: 114/74  Pulse: 91  Weight: 162 lb (73.5 kg)    Fetal Status: Fetal Heart Rate (bpm): 140 Fundal Height: 29 cm Movement: Present  Presentation: Vertex  General:  Alert, oriented and cooperative. Patient is in no acute distress.  Skin: Skin is warm and dry. No rash noted.   Cardiovascular: Normal heart rate noted  Respiratory: Normal respiratory effort, no problems with respiration noted  Abdomen: Soft, gravid, appropriate for gestational age.  Pain/Pressure: Absent     Pelvic: Cervical exam deferred        Extremities: Normal range of motion.  Edema: None  Mental Status: Normal mood and affect. Normal behavior. Normal judgment and thought content.   Assessment and Plan:  Pregnancy: G2P1001 at [redacted]w[redacted]d 1. [redacted] weeks gestation of pregnancy  - CBC - Glucose Tolerance, 2 Hours w/1 Hour - HIV Antibody (routine testing w rflx) - RPR - Genetic Screening - Tdap vaccine greater than or equal to 7yo IM  2. Supervision of other normal pregnancy, antepartum FHT and FH normal  3. H/o C/s Desires TOLAC  Preterm labor symptoms and general obstetric precautions including but not limited to vaginal bleeding, contractions, leaking of fluid and fetal movement were reviewed  in detail with the patient. Please refer to After Visit Summary for other counseling recommendations.   Return in about 2 weeks (around 12/13/2020) for OB f/u.  No future appointments.  Levie Heritage, DO

## 2020-11-30 LAB — CBC
Hematocrit: 30.3 % — ABNORMAL LOW (ref 34.0–46.6)
Hemoglobin: 10.1 g/dL — ABNORMAL LOW (ref 11.1–15.9)
MCH: 28 pg (ref 26.6–33.0)
MCHC: 33.3 g/dL (ref 31.5–35.7)
MCV: 84 fL (ref 79–97)
Platelets: 157 10*3/uL (ref 150–450)
RBC: 3.61 x10E6/uL — ABNORMAL LOW (ref 3.77–5.28)
RDW: 12.7 % (ref 11.7–15.4)
WBC: 5.7 10*3/uL (ref 3.4–10.8)

## 2020-11-30 LAB — GLUCOSE TOLERANCE, 2 HOURS W/ 1HR
Glucose, 1 hour: 95 mg/dL (ref 65–179)
Glucose, 2 hour: 95 mg/dL (ref 65–152)
Glucose, Fasting: 83 mg/dL (ref 65–91)

## 2020-11-30 LAB — HIV ANTIBODY (ROUTINE TESTING W REFLEX): HIV Screen 4th Generation wRfx: NONREACTIVE

## 2020-11-30 LAB — RPR: RPR Ser Ql: NONREACTIVE

## 2020-12-11 ENCOUNTER — Ambulatory Visit (INDEPENDENT_AMBULATORY_CARE_PROVIDER_SITE_OTHER): Payer: Managed Care, Other (non HMO) | Admitting: Family Medicine

## 2020-12-11 ENCOUNTER — Encounter: Payer: Self-pay | Admitting: General Practice

## 2020-12-11 ENCOUNTER — Other Ambulatory Visit: Payer: Self-pay

## 2020-12-11 VITALS — BP 123/71 | HR 97 | Wt 166.0 lb

## 2020-12-11 DIAGNOSIS — Z3A30 30 weeks gestation of pregnancy: Secondary | ICD-10-CM

## 2020-12-11 DIAGNOSIS — Z98891 History of uterine scar from previous surgery: Secondary | ICD-10-CM

## 2020-12-11 DIAGNOSIS — Z3480 Encounter for supervision of other normal pregnancy, unspecified trimester: Secondary | ICD-10-CM

## 2020-12-11 NOTE — Progress Notes (Signed)
+   Fetal movement. Pt does state that she has SOB with sitting, walking, and increased activity.

## 2020-12-11 NOTE — Progress Notes (Signed)
   PRENATAL VISIT NOTE  Subjective:  Heather Welch is a 35 y.o. G2P1001 at [redacted]w[redacted]d being seen today for ongoing prenatal care.  She is currently monitored for the following issues for this high-risk pregnancy and has Anxiety state; Headache; Supervision of other normal pregnancy, antepartum; and History of C-section on their problem list.  Patient reports no complaints.  Contractions: Not present. Vag. Bleeding: None.  Movement: Present. Denies leaking of fluid.   The following portions of the patient's history were reviewed and updated as appropriate: allergies, current medications, past family history, past medical history, past social history, past surgical history and problem list.   Objective:   Vitals:   12/11/20 0815  BP: 123/71  Pulse: 97  Weight: 166 lb (75.3 kg)    Fetal Status: Fetal Heart Rate (bpm): 141 Fundal Height: 30 cm Movement: Present     General:  Alert, oriented and cooperative. Patient is in no acute distress.  Skin: Skin is warm and dry. No rash noted.   Cardiovascular: Normal heart rate noted  Respiratory: Normal respiratory effort, no problems with respiration noted  Abdomen: Soft, gravid, appropriate for gestational age.  Pain/Pressure: Absent     Pelvic: Cervical exam deferred        Extremities: Normal range of motion.  Edema: None  Mental Status: Normal mood and affect. Normal behavior. Normal judgment and thought content.   Assessment and Plan:  Pregnancy: G2P1001 at [redacted]w[redacted]d 1. [redacted] weeks gestation of pregnancy FHT and FH normal  2. Supervision of other normal pregnancy, antepartum  3. History of C-section Desires TOLAC - counseling done.  Preterm labor symptoms and general obstetric precautions including but not limited to vaginal bleeding, contractions, leaking of fluid and fetal movement were reviewed in detail with the patient. Please refer to After Visit Summary for other counseling recommendations.   No follow-ups on file.  Future  Appointments  Date Time Provider Department Center  12/27/2020  8:15 AM Levie Heritage, DO [facility] None  01/09/2021  2:10 PM Conan Bowens, MD [facility] None  01/22/2021  3:50 PM Levie Heritage, DO [facility] None  01/29/2021  3:50 PM Reva Bores, MD [facility] None  02/06/2021  4:10 PM Levie Heritage, DO [facility] None    Levie Heritage, DO

## 2020-12-16 ENCOUNTER — Encounter: Payer: Self-pay | Admitting: General Practice

## 2020-12-27 ENCOUNTER — Other Ambulatory Visit: Payer: Self-pay

## 2020-12-27 ENCOUNTER — Ambulatory Visit (INDEPENDENT_AMBULATORY_CARE_PROVIDER_SITE_OTHER): Payer: Managed Care, Other (non HMO) | Admitting: Family Medicine

## 2020-12-27 VITALS — BP 122/74 | HR 97 | Wt 170.0 lb

## 2020-12-27 DIAGNOSIS — Z3A32 32 weeks gestation of pregnancy: Secondary | ICD-10-CM

## 2020-12-27 DIAGNOSIS — Z98891 History of uterine scar from previous surgery: Secondary | ICD-10-CM

## 2020-12-27 DIAGNOSIS — Z3480 Encounter for supervision of other normal pregnancy, unspecified trimester: Secondary | ICD-10-CM

## 2020-12-27 NOTE — Progress Notes (Signed)
   PRENATAL VISIT NOTE  Subjective:  Heather Welch is a 35 y.o. G2P1001 at [redacted]w[redacted]d being seen today for ongoing prenatal care.  She is currently monitored for the following issues for this low-risk pregnancy and has Anxiety state; Headache; Supervision of other normal pregnancy, antepartum; and History of C-section on their problem list.  Patient reports no complaints.  Contractions: Not present. Vag. Bleeding: None.  Movement: Present. Denies leaking of fluid.   The following portions of the patient's history were reviewed and updated as appropriate: allergies, current medications, past family history, past medical history, past social history, past surgical history and problem list.   Objective:   Vitals:   12/27/20 0825  BP: 122/74  Pulse: 97  Weight: 170 lb (77.1 kg)    Fetal Status: Fetal Heart Rate (bpm): 134 Fundal Height: 33 cm Movement: Present  Presentation: Vertex  General:  Alert, oriented and cooperative. Patient is in no acute distress.  Skin: Skin is warm and dry. No rash noted.   Cardiovascular: Normal heart rate noted  Respiratory: Normal respiratory effort, no problems with respiration noted  Abdomen: Soft, gravid, appropriate for gestational age.  Pain/Pressure: Present     Pelvic: Cervical exam deferred        Extremities: Normal range of motion.  Edema: None  Mental Status: Normal mood and affect. Normal behavior. Normal judgment and thought content.   Assessment and Plan:  Pregnancy: G2P1001 at [redacted]w[redacted]d 1. [redacted] weeks gestation of pregnancy  2. Supervision of other normal pregnancy, antepartum FHT and FH normal  3. History of C-section Desires TOLAC  Preterm labor symptoms and general obstetric precautions including but not limited to vaginal bleeding, contractions, leaking of fluid and fetal movement were reviewed in detail with the patient. Please refer to After Visit Summary for other counseling recommendations.   No follow-ups on file.  Future  Appointments  Date Time Provider Department Center  01/09/2021  2:10 PM Conan Bowens, MD [facility] None  01/22/2021  3:50 PM Levie Heritage, DO [facility] None  01/29/2021  3:50 PM Reva Bores, MD [facility] None  02/06/2021  4:10 PM Levie Heritage, DO [facility] None    Levie Heritage, DO

## 2021-01-09 ENCOUNTER — Ambulatory Visit (INDEPENDENT_AMBULATORY_CARE_PROVIDER_SITE_OTHER): Payer: Managed Care, Other (non HMO) | Admitting: Obstetrics and Gynecology

## 2021-01-09 ENCOUNTER — Other Ambulatory Visit: Payer: Self-pay

## 2021-01-09 ENCOUNTER — Encounter: Payer: Self-pay | Admitting: General Practice

## 2021-01-09 VITALS — BP 124/74 | HR 108 | Wt 173.0 lb

## 2021-01-09 DIAGNOSIS — O26849 Uterine size-date discrepancy, unspecified trimester: Secondary | ICD-10-CM

## 2021-01-09 DIAGNOSIS — Z3480 Encounter for supervision of other normal pregnancy, unspecified trimester: Secondary | ICD-10-CM

## 2021-01-09 DIAGNOSIS — Z98891 History of uterine scar from previous surgery: Secondary | ICD-10-CM

## 2021-01-09 DIAGNOSIS — Z3A34 34 weeks gestation of pregnancy: Secondary | ICD-10-CM

## 2021-01-09 NOTE — Progress Notes (Addendum)
   PRENATAL VISIT NOTE  Subjective:  Heather Welch is a 35 y.o. G2P1001 at [redacted]w[redacted]d being seen today for ongoing prenatal care.  She is currently monitored for the following issues for this low-risk pregnancy and has Anxiety state; Headache; Supervision of other normal pregnancy, antepartum; and History of C-section on their problem list.  Patient reports no complaints.  Contractions: Not present. Vag. Bleeding: None.  Movement: Present. Denies leaking of fluid.   The following portions of the patient's history were reviewed and updated as appropriate: allergies, current medications, past family history, past medical history, past social history, past surgical history and problem list.   Objective:   Vitals:   01/09/21 1413  BP: 124/74  Pulse: (!) 108  Weight: 173 lb (78.5 kg)    Fetal Status: Fetal Heart Rate (bpm): 136   Movement: Present     General:  Alert, oriented and cooperative. Patient is in no acute distress.  Skin: Skin is warm and dry. No rash noted.   Cardiovascular: Normal heart rate noted  Respiratory: Normal respiratory effort, no problems with respiration noted  Abdomen: Soft, gravid, appropriate for gestational age.  Pain/Pressure: Absent     Pelvic: Cervical exam deferred        Extremities: Normal range of motion.  Edema: None  Mental Status: Normal mood and affect. Normal behavior. Normal judgment and thought content.   Assessment and Plan:  Pregnancy: G2P1001 at [redacted]w[redacted]d 1. [redacted] weeks gestation of pregnancy  2. Supervision of other normal pregnancy, antepartum  3. History of C-section Reviewed risks/benefits of TOLAC versus RCS in detail. Patient counseled regarding potential vaginal delivery, chance of success, future implications, possible uterine rupture and need for urgent/emergent repeat cesarean. Counseled regarding potential need for repeat c-section for reasons unrelated to first c-section. Counseled regarding scheduled repeat cesarean including risks of  bleeding, infection, damage to surrounding tissue, abnormal placentation, implications for future pregnancies. All questions answered.  Patient desires TOLAC, consent signed 01/09/2021.  4. Size of fetus inconsistent with dates, antepartum Size > dates today - [place] MFM OB FOLLOW UP; Future    Preterm labor symptoms and general obstetric precautions including but not limited to vaginal bleeding, contractions, leaking of fluid and fetal movement were reviewed in detail with the patient. Please refer to After Visit Summary for other counseling recommendations.   Return in about 2 weeks (around 01/23/2021) for high OB, in person, 36 week swabs.  Future Appointments  Date Time Provider Department Center  01/22/2021  3:50 PM Levie Heritage, DO [facility] None  01/29/2021  3:50 PM Reva Bores, MD [facility] None  02/06/2021  4:10 PM Levie Heritage, DO [facility] None    Conan Bowens, MD

## 2021-01-09 NOTE — Addendum Note (Signed)
Addended by: Leroy Libman on: 01/09/2021 02:52 PM   Modules accepted: Orders

## 2021-01-21 ENCOUNTER — Ambulatory Visit: Payer: Managed Care, Other (non HMO) | Admitting: *Deleted

## 2021-01-21 ENCOUNTER — Ambulatory Visit: Payer: Managed Care, Other (non HMO) | Attending: Obstetrics and Gynecology

## 2021-01-21 ENCOUNTER — Other Ambulatory Visit: Payer: Self-pay

## 2021-01-21 ENCOUNTER — Encounter: Payer: Self-pay | Admitting: *Deleted

## 2021-01-21 VITALS — BP 124/72 | HR 77

## 2021-01-21 DIAGNOSIS — Z3480 Encounter for supervision of other normal pregnancy, unspecified trimester: Secondary | ICD-10-CM

## 2021-01-21 DIAGNOSIS — O99343 Other mental disorders complicating pregnancy, third trimester: Secondary | ICD-10-CM | POA: Diagnosis not present

## 2021-01-21 DIAGNOSIS — F419 Anxiety disorder, unspecified: Secondary | ICD-10-CM

## 2021-01-21 DIAGNOSIS — Z98891 History of uterine scar from previous surgery: Secondary | ICD-10-CM | POA: Diagnosis present

## 2021-01-21 DIAGNOSIS — Z3A36 36 weeks gestation of pregnancy: Secondary | ICD-10-CM

## 2021-01-21 DIAGNOSIS — O26843 Uterine size-date discrepancy, third trimester: Secondary | ICD-10-CM

## 2021-01-21 DIAGNOSIS — O34219 Maternal care for unspecified type scar from previous cesarean delivery: Secondary | ICD-10-CM | POA: Diagnosis not present

## 2021-01-21 DIAGNOSIS — O26849 Uterine size-date discrepancy, unspecified trimester: Secondary | ICD-10-CM | POA: Insufficient documentation

## 2021-01-21 DIAGNOSIS — Z362 Encounter for other antenatal screening follow-up: Secondary | ICD-10-CM

## 2021-01-22 ENCOUNTER — Other Ambulatory Visit (HOSPITAL_COMMUNITY)
Admission: RE | Admit: 2021-01-22 | Discharge: 2021-01-22 | Disposition: A | Discharge status: Home / Self Care | Payer: Managed Care, Other (non HMO) | Source: Ambulatory Visit | Attending: Family Medicine | Admitting: Family Medicine

## 2021-01-22 ENCOUNTER — Ambulatory Visit (INDEPENDENT_AMBULATORY_CARE_PROVIDER_SITE_OTHER): Payer: Managed Care, Other (non HMO) | Admitting: Family Medicine

## 2021-01-22 VITALS — BP 124/75 | HR 92 | Wt 174.0 lb

## 2021-01-22 DIAGNOSIS — Z98891 History of uterine scar from previous surgery: Secondary | ICD-10-CM

## 2021-01-22 DIAGNOSIS — Z3480 Encounter for supervision of other normal pregnancy, unspecified trimester: Secondary | ICD-10-CM | POA: Diagnosis present

## 2021-01-22 DIAGNOSIS — Z3A36 36 weeks gestation of pregnancy: Secondary | ICD-10-CM

## 2021-01-22 NOTE — Progress Notes (Signed)
   PRENATAL VISIT NOTE  Subjective:  Heather Welch is a 35 y.o. G2P1001 at [redacted]w[redacted]d being seen today for ongoing prenatal care.  She is currently monitored for the following issues for this low-risk pregnancy and has Anxiety state; Headache; Supervision of other normal pregnancy, antepartum; and History of C-section on their problem list.  Patient reports no complaints.  Contractions: Irritability. Vag. Bleeding: None.  Movement: Present. Denies leaking of fluid.   The following portions of the patient's history were reviewed and updated as appropriate: allergies, current medications, past family history, past medical history, past social history, past surgical history and problem list.   Objective:   Vitals:   01/22/21 1559  BP: 124/75  Pulse: 92  Weight: 174 lb (78.9 kg)    Fetal Status: Fetal Heart Rate (bpm): 155 Fundal Height: 37 cm Movement: Present     General:  Alert, oriented and cooperative. Patient is in no acute distress.  Skin: Skin is warm and dry. No rash noted.   Cardiovascular: Normal heart rate noted  Respiratory: Normal respiratory effort, no problems with respiration noted  Abdomen: Soft, gravid, appropriate for gestational age.  Pain/Pressure: Absent     Pelvic: Cervical exam deferred        Extremities: Normal range of motion.  Edema: None  Mental Status: Normal mood and affect. Normal behavior. Normal judgment and thought content.   Assessment and Plan:  Pregnancy: G2P1001 at [redacted]w[redacted]d 1. [redacted] weeks gestation of pregnancy FHT and FH normal EFW 6#10oz.  2. Supervision of other normal pregnancy, antepartum   3. History of C-section Desires TOLAC  Preterm labor symptoms and general obstetric precautions including but not limited to vaginal bleeding, contractions, leaking of fluid and fetal movement were reviewed in detail with the patient. Please refer to After Visit Summary for other counseling recommendations.   No follow-ups on file.  Future Appointments   Date Time Provider Department Center  01/29/2021  3:50 PM Reva Bores, MD [facility] None  02/06/2021  4:10 PM Levie Heritage, DO [facility] None  02/13/2021  4:10 PM Levie Heritage, DO [facility] None    Levie Heritage, DO

## 2021-01-24 LAB — GC/CHLAMYDIA PROBE AMP (~~LOC~~) NOT AT ARMC
Chlamydia: NEGATIVE
Comment: NEGATIVE
Comment: NORMAL
Neisseria Gonorrhea: NEGATIVE

## 2021-01-25 LAB — CULTURE, BETA STREP (GROUP B ONLY): Strep Gp B Culture: POSITIVE — AB

## 2021-01-27 ENCOUNTER — Encounter: Payer: Self-pay | Admitting: Family Medicine

## 2021-01-27 DIAGNOSIS — B951 Streptococcus, group B, as the cause of diseases classified elsewhere: Secondary | ICD-10-CM | POA: Insufficient documentation

## 2021-01-29 ENCOUNTER — Ambulatory Visit (INDEPENDENT_AMBULATORY_CARE_PROVIDER_SITE_OTHER): Payer: Managed Care, Other (non HMO) | Admitting: Family Medicine

## 2021-01-29 ENCOUNTER — Other Ambulatory Visit: Payer: Self-pay

## 2021-01-29 VITALS — BP 128/79 | HR 101 | Wt 175.0 lb

## 2021-01-29 DIAGNOSIS — Z3480 Encounter for supervision of other normal pregnancy, unspecified trimester: Secondary | ICD-10-CM

## 2021-01-29 DIAGNOSIS — Z98891 History of uterine scar from previous surgery: Secondary | ICD-10-CM

## 2021-01-29 DIAGNOSIS — Z3A37 37 weeks gestation of pregnancy: Secondary | ICD-10-CM

## 2021-01-29 NOTE — Patient Instructions (Signed)

## 2021-01-29 NOTE — Progress Notes (Signed)
   PRENATAL VISIT NOTE  Subjective:  Heather Welch is a 35 y.o. G2P1001 at [redacted]w[redacted]d being seen today for ongoing prenatal care.  She is currently monitored for the following issues for this low-risk pregnancy and has Anxiety state; Headache; Supervision of other normal pregnancy, antepartum; History of C-section; and Positive GBS test on their problem list.  Patient reports no complaints.  Contractions: Not present. Vag. Bleeding: None.  Movement: Present. Denies leaking of fluid.   The following portions of the patient's history were reviewed and updated as appropriate: allergies, current medications, past family history, past medical history, past social history, past surgical history and problem list.   Objective:   Vitals:   01/29/21 1556  BP: 128/79  Pulse: (!) 101  Weight: 175 lb (79.4 kg)    Fetal Status: Fetal Heart Rate (bpm): 145   Movement: Present     General:  Alert, oriented and cooperative. Patient is in no acute distress.  Skin: Skin is warm and dry. No rash noted.   Cardiovascular: Normal heart rate noted  Respiratory: Normal respiratory effort, no problems with respiration noted  Abdomen: Soft, gravid, appropriate for gestational age.  Pain/Pressure: Absent     Pelvic: Cervical exam deferred        Extremities: Normal range of motion.  Edema: None  Mental Status: Normal mood and affect. Normal behavior. Normal judgment and thought content.   Assessment and Plan:  Pregnancy: G2P1001 at [redacted]w[redacted]d 1. Supervision of other normal pregnancy, antepartum Continue routine prenatal care.  2. History of C-section For TOLAC--labor precautions  3. [redacted] weeks gestation of pregnancy   Term labor symptoms and general obstetric precautions including but not limited to vaginal bleeding, contractions, leaking of fluid and fetal movement were reviewed in detail with the patient. Please refer to After Visit Summary for other counseling recommendations.   Return in 1 week (on  02/05/2021).  Future Appointments  Date Time Provider Department Center  02/06/2021  4:10 PM Levie Heritage, DO [facility] None  02/13/2021  4:10 PM Adrian Blackwater Rhona Raider, DO [facility] None    Reva Bores, MD

## 2021-02-06 ENCOUNTER — Other Ambulatory Visit: Payer: Self-pay

## 2021-02-06 ENCOUNTER — Ambulatory Visit (INDEPENDENT_AMBULATORY_CARE_PROVIDER_SITE_OTHER): Payer: Managed Care, Other (non HMO) | Admitting: Family Medicine

## 2021-02-06 VITALS — BP 121/85 | HR 71 | Wt 177.2 lb

## 2021-02-06 DIAGNOSIS — Z98891 History of uterine scar from previous surgery: Secondary | ICD-10-CM

## 2021-02-06 DIAGNOSIS — B951 Streptococcus, group B, as the cause of diseases classified elsewhere: Secondary | ICD-10-CM

## 2021-02-06 DIAGNOSIS — Z3480 Encounter for supervision of other normal pregnancy, unspecified trimester: Secondary | ICD-10-CM

## 2021-02-06 NOTE — Progress Notes (Signed)
   PRENATAL VISIT NOTE  Subjective:  Heather Welch is a 34 y.o. G2P1001 at [redacted]w[redacted]d being seen today for ongoing prenatal care.  She is currently monitored for the following issues for this high-risk pregnancy and has Anxiety state; Headache; Supervision of other normal pregnancy, antepartum; History of C-section; and Positive GBS test on their problem list.  Patient reports occasional contractions.  Contractions: Not present. Vag. Bleeding: None.  Movement: Present. Denies leaking of fluid.   The following portions of the patient's history were reviewed and updated as appropriate: allergies, current medications, past family history, past medical history, past social history, past surgical history and problem list.   Objective:   Vitals:   02/06/21 1609  BP: 121/85  Pulse: 71  Weight: 177 lb 3.2 oz (80.4 kg)    Fetal Status: Fetal Heart Rate (bpm): 155 Fundal Height: 38 cm Movement: Present  Presentation: Vertex  General:  Alert, oriented and cooperative. Patient is in no acute distress.  Skin: Skin is warm and dry. No rash noted.   Cardiovascular: Normal heart rate noted  Respiratory: Normal respiratory effort, no problems with respiration noted  Abdomen: Soft, gravid, appropriate for gestational age.  Pain/Pressure: Absent     Pelvic: Cervical exam performed in the presence of a chaperone Dilation: Fingertip Effacement (%): Thick Station: -3  Extremities: Normal range of motion.  Edema: Trace  Mental Status: Normal mood and affect. Normal behavior. Normal judgment and thought content.   Assessment and Plan:  Pregnancy: G2P1001 at [redacted]w[redacted]d 1. Supervision of other normal pregnancy, antepartum FHT and FH normal - [place] MFM FETAL BPP WO NON STRESS; Future  2. Positive GBS test Intrapartum Ppx  3. History of C-section   Term labor symptoms and general obstetric precautions including but not limited to vaginal bleeding, contractions, leaking of fluid and fetal movement were reviewed in  detail with the patient. Please refer to After Visit Summary for other counseling recommendations.   No follow-ups on file.  Future Appointments  Date Time Provider Department Center  02/13/2021  4:10 PM Levie Heritage, DO [facility] None    Levie Heritage, DO

## 2021-02-12 ENCOUNTER — Inpatient Hospital Stay (HOSPITAL_COMMUNITY)
Admission: AD | Admit: 2021-02-12 | Discharge: 2021-02-14 | DRG: 786 | Disposition: A | Discharge status: Home / Self Care | Payer: Managed Care, Other (non HMO) | Attending: Obstetrics & Gynecology | Admitting: Obstetrics & Gynecology

## 2021-02-12 ENCOUNTER — Encounter (HOSPITAL_COMMUNITY)
Admission: AD | Disposition: A | Discharge status: Home / Self Care | Payer: Self-pay | Source: Home / Self Care | Attending: Obstetrics & Gynecology

## 2021-02-12 ENCOUNTER — Encounter (HOSPITAL_COMMUNITY): Payer: Self-pay | Admitting: Obstetrics & Gynecology

## 2021-02-12 ENCOUNTER — Inpatient Hospital Stay (HOSPITAL_COMMUNITY): Payer: Managed Care, Other (non HMO) | Admitting: Anesthesiology

## 2021-02-12 ENCOUNTER — Other Ambulatory Visit: Payer: Self-pay

## 2021-02-12 DIAGNOSIS — O411230 Chorioamnionitis, third trimester, not applicable or unspecified: Secondary | ICD-10-CM | POA: Diagnosis present

## 2021-02-12 DIAGNOSIS — O99824 Streptococcus B carrier state complicating childbirth: Secondary | ICD-10-CM | POA: Diagnosis present

## 2021-02-12 DIAGNOSIS — D62 Acute posthemorrhagic anemia: Secondary | ICD-10-CM | POA: Diagnosis not present

## 2021-02-12 DIAGNOSIS — Z3A39 39 weeks gestation of pregnancy: Secondary | ICD-10-CM

## 2021-02-12 DIAGNOSIS — O9081 Anemia of the puerperium: Secondary | ICD-10-CM | POA: Diagnosis not present

## 2021-02-12 DIAGNOSIS — Z20822 Contact with and (suspected) exposure to covid-19: Secondary | ICD-10-CM | POA: Diagnosis present

## 2021-02-12 DIAGNOSIS — O26893 Other specified pregnancy related conditions, third trimester: Secondary | ICD-10-CM | POA: Diagnosis present

## 2021-02-12 DIAGNOSIS — Z3480 Encounter for supervision of other normal pregnancy, unspecified trimester: Secondary | ICD-10-CM

## 2021-02-12 DIAGNOSIS — O34211 Maternal care for low transverse scar from previous cesarean delivery: Secondary | ICD-10-CM | POA: Diagnosis present

## 2021-02-12 HISTORY — DX: History of uterine scar from previous surgery: Z98.891

## 2021-02-12 HISTORY — DX: Torsion of fallopian tube, unspecified side: N83.529

## 2021-02-12 LAB — CBC
HCT: 36.6 % (ref 36.0–46.0)
Hemoglobin: 11.7 g/dL — ABNORMAL LOW (ref 12.0–15.0)
MCH: 26.5 pg (ref 26.0–34.0)
MCHC: 32 g/dL (ref 30.0–36.0)
MCV: 83 fL (ref 80.0–100.0)
Platelets: 125 K/uL — ABNORMAL LOW (ref 150–400)
RBC: 4.41 MIL/uL (ref 3.87–5.11)
RDW: 14.9 % (ref 11.5–15.5)
WBC: 7.5 K/uL (ref 4.0–10.5)
nRBC: 0 % (ref 0.0–0.2)

## 2021-02-12 LAB — TYPE AND SCREEN
ABO/RH(D): A POS
Antibody Screen: NEGATIVE

## 2021-02-12 LAB — RESP PANEL BY RT-PCR (FLU A&B, COVID) ARPGX2
Influenza A by PCR: NEGATIVE
Influenza B by PCR: NEGATIVE
SARS Coronavirus 2 by RT PCR: NEGATIVE

## 2021-02-12 LAB — RPR: RPR Ser Ql: NONREACTIVE

## 2021-02-12 SURGERY — Surgical Case
Anesthesia: Epidural | Wound class: Clean Contaminated

## 2021-02-12 MED ORDER — IBUPROFEN 600 MG PO TABS
600.0000 mg | ORAL_TABLET | Freq: Four times a day (QID) | ORAL | Status: DC
  Administered 2021-02-13 (×2): 600 mg via ORAL
  Filled 2021-02-12 (×2): qty 1

## 2021-02-12 MED ORDER — MENTHOL 3 MG MT LOZG: 1.0000 | LOZENGE | OROMUCOSAL | Status: DC | PRN

## 2021-02-12 MED ORDER — FENTANYL CITRATE (PF) 100 MCG/2ML IJ SOLN
INTRAMUSCULAR | Status: AC
  Filled 2021-02-12: qty 2

## 2021-02-12 MED ORDER — PHENYLEPHRINE 40 MCG/ML (10ML) SYRINGE FOR IV PUSH (FOR BLOOD PRESSURE SUPPORT): 80.0000 ug | PREFILLED_SYRINGE | INTRAVENOUS | Status: DC | PRN

## 2021-02-12 MED ORDER — GENTAMICIN SULFATE 40 MG/ML IJ SOLN
5.0000 mg/kg | INTRAMUSCULAR | Status: AC
  Administered 2021-02-12: 400 mg via INTRAVENOUS
  Filled 2021-02-12: qty 10

## 2021-02-12 MED ORDER — ACETAMINOPHEN 10 MG/ML IV SOLN
INTRAVENOUS | Status: DC | PRN
  Administered 2021-02-12: 1000 mg via INTRAVENOUS

## 2021-02-12 MED ORDER — SOD CITRATE-CITRIC ACID 500-334 MG/5ML PO SOLN: 30.0000 mL | ORAL | Status: DC

## 2021-02-12 MED ORDER — FENTANYL-BUPIVACAINE-NACL 0.5-0.125-0.9 MG/250ML-% EP SOLN: 12.0000 mL/h | EPIDURAL | Status: DC | PRN

## 2021-02-12 MED ORDER — LIDOCAINE HCL (PF) 1 % IJ SOLN
INTRAMUSCULAR | Status: DC | PRN
  Administered 2021-02-12: 3 mL via EPIDURAL
  Administered 2021-02-12: 4 mL via EPIDURAL

## 2021-02-12 MED ORDER — FENTANYL-BUPIVACAINE-NACL 0.5-0.125-0.9 MG/250ML-% EP SOLN
EPIDURAL | Status: AC
  Filled 2021-02-12: qty 250

## 2021-02-12 MED ORDER — NALBUPHINE HCL 10 MG/ML IJ SOLN: 5.0000 mg | Freq: Once | INTRAMUSCULAR | Status: DC | PRN

## 2021-02-12 MED ORDER — HYDROMORPHONE HCL 1 MG/ML IJ SOLN: 0.2500 mg | INTRAMUSCULAR | Status: DC | PRN

## 2021-02-12 MED ORDER — LACTATED RINGERS IV SOLN
500.0000 mL | Freq: Once | INTRAVENOUS | Status: AC
  Administered 2021-02-12: 500 mL via INTRAVENOUS

## 2021-02-12 MED ORDER — EPHEDRINE 5 MG/ML INJ: 10.0000 mg | INTRAVENOUS | Status: DC | PRN

## 2021-02-12 MED ORDER — MORPHINE SULFATE (PF) 0.5 MG/ML IJ SOLN
INTRAMUSCULAR | Status: DC | PRN
  Administered 2021-02-12: 3 mg via EPIDURAL

## 2021-02-12 MED ORDER — SODIUM CHLORIDE 0.9 % IR SOLN
Status: DC | PRN
  Administered 2021-02-12: 1000 mL

## 2021-02-12 MED ORDER — SIMETHICONE 80 MG PO CHEW
80.0000 mg | CHEWABLE_TABLET | Freq: Three times a day (TID) | ORAL | Status: DC
  Administered 2021-02-13 – 2021-02-14 (×3): 80 mg via ORAL
  Filled 2021-02-12 (×3): qty 1

## 2021-02-12 MED ORDER — NALOXONE HCL 0.4 MG/ML IJ SOLN: 0.4000 mg | INTRAMUSCULAR | Status: DC | PRN

## 2021-02-12 MED ORDER — ONDANSETRON HCL 4 MG/2ML IJ SOLN
INTRAMUSCULAR | Status: DC | PRN
  Administered 2021-02-12: 4 mg via INTRAVENOUS

## 2021-02-12 MED ORDER — MEASLES, MUMPS & RUBELLA VAC IJ SOLR: 0.5000 mL | Freq: Once | INTRAMUSCULAR | Status: DC

## 2021-02-12 MED ORDER — SODIUM CHLORIDE 0.9% FLUSH: 3.0000 mL | INTRAVENOUS | Status: DC | PRN

## 2021-02-12 MED ORDER — OXYCODONE-ACETAMINOPHEN 5-325 MG PO TABS: 1.0000 | ORAL_TABLET | ORAL | Status: DC | PRN

## 2021-02-12 MED ORDER — ACETAMINOPHEN 10 MG/ML IV SOLN: 1000.0000 mg | Freq: Once | INTRAVENOUS | Status: DC | PRN

## 2021-02-12 MED ORDER — OXYCODONE HCL 5 MG PO TABS: 5.0000 mg | ORAL_TABLET | ORAL | Status: DC | PRN

## 2021-02-12 MED ORDER — DEXAMETHASONE SODIUM PHOSPHATE 10 MG/ML IJ SOLN
INTRAMUSCULAR | Status: DC | PRN
  Administered 2021-02-12: 5 mg via INTRAVENOUS

## 2021-02-12 MED ORDER — LACTATED RINGERS IV SOLN: 500.0000 mL | INTRAVENOUS | Status: DC | PRN

## 2021-02-12 MED ORDER — ONDANSETRON HCL 4 MG/2ML IJ SOLN: 4.0000 mg | Freq: Three times a day (TID) | INTRAMUSCULAR | Status: DC | PRN

## 2021-02-12 MED ORDER — ZOLPIDEM TARTRATE 5 MG PO TABS: 5.0000 mg | ORAL_TABLET | Freq: Every evening | ORAL | Status: DC | PRN

## 2021-02-12 MED ORDER — FENTANYL CITRATE (PF) 100 MCG/2ML IJ SOLN: 50.0000 ug | INTRAMUSCULAR | Status: DC | PRN

## 2021-02-12 MED ORDER — LACTATED RINGERS IV SOLN: INTRAVENOUS | Status: DC

## 2021-02-12 MED ORDER — ACETAMINOPHEN 500 MG PO TABS
1000.0000 mg | ORAL_TABLET | Freq: Four times a day (QID) | ORAL | Status: DC
  Administered 2021-02-13 – 2021-02-14 (×6): 1000 mg via ORAL
  Filled 2021-02-12 (×7): qty 2

## 2021-02-12 MED ORDER — DIPHENHYDRAMINE HCL 50 MG/ML IJ SOLN: 12.5000 mg | INTRAMUSCULAR | Status: DC | PRN

## 2021-02-12 MED ORDER — GENTAMICIN SULFATE 40 MG/ML IJ SOLN: 1.5000 mg/kg | Freq: Three times a day (TID) | INTRAVENOUS | Status: DC

## 2021-02-12 MED ORDER — SENNOSIDES-DOCUSATE SODIUM 8.6-50 MG PO TABS
2.0000 | ORAL_TABLET | ORAL | Status: DC
  Administered 2021-02-13 – 2021-02-14 (×2): 2 via ORAL
  Filled 2021-02-12 (×2): qty 2

## 2021-02-12 MED ORDER — STERILE WATER FOR IRRIGATION IR SOLN
Status: DC | PRN
  Administered 2021-02-12: 1000 mL

## 2021-02-12 MED ORDER — LIDOCAINE HCL (PF) 2 % IJ SOLN
INTRAMUSCULAR | Status: AC
  Filled 2021-02-12: qty 5

## 2021-02-12 MED ORDER — OXYCODONE HCL 5 MG/5ML PO SOLN: 5.0000 mg | Freq: Once | ORAL | Status: DC | PRN

## 2021-02-12 MED ORDER — FENTANYL-BUPIVACAINE-NACL 0.5-0.125-0.9 MG/250ML-% EP SOLN
EPIDURAL | Status: DC | PRN
  Administered 2021-02-12: 11 mL/h via EPIDURAL

## 2021-02-12 MED ORDER — DIPHENHYDRAMINE HCL 25 MG PO CAPS: 25.0000 mg | ORAL_CAPSULE | Freq: Four times a day (QID) | ORAL | Status: DC | PRN

## 2021-02-12 MED ORDER — ONDANSETRON HCL 4 MG/2ML IJ SOLN
INTRAMUSCULAR | Status: AC
  Filled 2021-02-12: qty 2

## 2021-02-12 MED ORDER — PROMETHAZINE HCL 25 MG/ML IJ SOLN: 6.2500 mg | INTRAMUSCULAR | Status: DC | PRN

## 2021-02-12 MED ORDER — ONDANSETRON HCL 4 MG/2ML IJ SOLN: 4.0000 mg | Freq: Four times a day (QID) | INTRAMUSCULAR | Status: DC | PRN

## 2021-02-12 MED ORDER — SODIUM CHLORIDE 0.9 % IV SOLN: INTRAVENOUS | Status: DC | PRN

## 2021-02-12 MED ORDER — PHENYLEPHRINE HCL (PRESSORS) 10 MG/ML IV SOLN
INTRAVENOUS | Status: DC | PRN
  Administered 2021-02-12: 80 ug via INTRAVENOUS
  Administered 2021-02-12 (×2): 40 ug via INTRAVENOUS
  Administered 2021-02-12: 80 ug via INTRAVENOUS

## 2021-02-12 MED ORDER — COCONUT OIL OIL: 1.0000 "application " | TOPICAL_OIL | Status: DC | PRN

## 2021-02-12 MED ORDER — SCOPOLAMINE 1 MG/3DAYS TD PT72
1.0000 | MEDICATED_PATCH | Freq: Once | TRANSDERMAL | Status: DC
  Administered 2021-02-12: 1.5 mg via TRANSDERMAL
  Filled 2021-02-12: qty 1

## 2021-02-12 MED ORDER — TRANEXAMIC ACID-NACL 1000-0.7 MG/100ML-% IV SOLN
INTRAVENOUS | Status: AC
  Filled 2021-02-12: qty 100

## 2021-02-12 MED ORDER — TERBUTALINE SULFATE 1 MG/ML IJ SOLN
INTRAMUSCULAR | Status: AC
  Filled 2021-02-12: qty 1

## 2021-02-12 MED ORDER — SODIUM BICARBONATE 8.4 % IV SOLN: INTRAVENOUS | Status: DC | PRN

## 2021-02-12 MED ORDER — LIDOCAINE-EPINEPHRINE (PF) 2 %-1:200000 IJ SOLN
INTRAMUSCULAR | Status: DC | PRN
  Administered 2021-02-12 (×2): 5 mL via EPIDURAL

## 2021-02-12 MED ORDER — OXYTOCIN BOLUS FROM INFUSION: 333.0000 mL | Freq: Once | INTRAVENOUS | Status: DC

## 2021-02-12 MED ORDER — NALBUPHINE HCL 10 MG/ML IJ SOLN: 5.0000 mg | INTRAMUSCULAR | Status: DC | PRN

## 2021-02-12 MED ORDER — CEFAZOLIN SODIUM-DEXTROSE 2-4 GM/100ML-% IV SOLN
2.0000 g | Freq: Once | INTRAVENOUS | Status: AC
  Administered 2021-02-12: 2 g via INTRAVENOUS

## 2021-02-12 MED ORDER — TERBUTALINE SULFATE 1 MG/ML IJ SOLN
0.2500 mg | Freq: Once | INTRAMUSCULAR | Status: AC
  Administered 2021-02-12: 0.25 mg via SUBCUTANEOUS

## 2021-02-12 MED ORDER — FENTANYL CITRATE (PF) 100 MCG/2ML IJ SOLN
INTRAMUSCULAR | Status: DC | PRN
  Administered 2021-02-12: 100 ug via EPIDURAL

## 2021-02-12 MED ORDER — PHENYLEPHRINE HCL-NACL 20-0.9 MG/250ML-% IV SOLN
INTRAVENOUS | Status: AC
  Filled 2021-02-12: qty 250

## 2021-02-12 MED ORDER — OXYTOCIN-SODIUM CHLORIDE 30-0.9 UT/500ML-% IV SOLN: 2.5000 [IU]/h | INTRAVENOUS | Status: AC

## 2021-02-12 MED ORDER — SODIUM CHLORIDE 0.9 % IV SOLN
INTRAVENOUS | Status: AC
  Filled 2021-02-12: qty 500

## 2021-02-12 MED ORDER — OXYTOCIN-SODIUM CHLORIDE 30-0.9 UT/500ML-% IV SOLN
2.5000 [IU]/h | INTRAVENOUS | Status: DC
  Filled 2021-02-12: qty 500

## 2021-02-12 MED ORDER — PHENYLEPHRINE 40 MCG/ML (10ML) SYRINGE FOR IV PUSH (FOR BLOOD PRESSURE SUPPORT)
PREFILLED_SYRINGE | INTRAVENOUS | Status: AC
  Filled 2021-02-12: qty 10

## 2021-02-12 MED ORDER — ENOXAPARIN SODIUM 40 MG/0.4ML IJ SOSY
40.0000 mg | PREFILLED_SYRINGE | INTRAMUSCULAR | Status: DC
  Administered 2021-02-13 – 2021-02-14 (×2): 40 mg via SUBCUTANEOUS
  Filled 2021-02-12 (×2): qty 0.4

## 2021-02-12 MED ORDER — WITCH HAZEL-GLYCERIN EX PADS: 1.0000 "application " | MEDICATED_PAD | CUTANEOUS | Status: DC | PRN

## 2021-02-12 MED ORDER — NALOXONE HCL 4 MG/10ML IJ SOLN
1.0000 ug/kg/h | INTRAVENOUS | Status: DC | PRN
  Filled 2021-02-12: qty 5

## 2021-02-12 MED ORDER — MORPHINE SULFATE (PF) 0.5 MG/ML IJ SOLN
INTRAMUSCULAR | Status: AC
  Filled 2021-02-12: qty 10

## 2021-02-12 MED ORDER — SODIUM CHLORIDE 0.9 % IV SOLN
2.0000 g | Freq: Once | INTRAVENOUS | Status: AC
  Administered 2021-02-12: 2 g via INTRAVENOUS
  Filled 2021-02-12: qty 2000

## 2021-02-12 MED ORDER — ACETAMINOPHEN 10 MG/ML IV SOLN
INTRAVENOUS | Status: AC
  Filled 2021-02-12: qty 100

## 2021-02-12 MED ORDER — SODIUM CHLORIDE 0.9 % IV SOLN
INTRAVENOUS | Status: DC | PRN
  Administered 2021-02-12: 500 mg via INTRAVENOUS

## 2021-02-12 MED ORDER — KETOROLAC TROMETHAMINE 30 MG/ML IJ SOLN
INTRAMUSCULAR | Status: AC
  Filled 2021-02-12: qty 1

## 2021-02-12 MED ORDER — OXYTOCIN-SODIUM CHLORIDE 30-0.9 UT/500ML-% IV SOLN
1.0000 m[IU]/min | INTRAVENOUS | Status: DC
  Administered 2021-02-12: 2 m[IU]/min via INTRAVENOUS

## 2021-02-12 MED ORDER — SOD CITRATE-CITRIC ACID 500-334 MG/5ML PO SOLN
30.0000 mL | ORAL | Status: DC | PRN
  Administered 2021-02-12: 30 mL via ORAL
  Filled 2021-02-12: qty 30

## 2021-02-12 MED ORDER — DIBUCAINE (PERIANAL) 1 % EX OINT: 1.0000 "application " | TOPICAL_OINTMENT | CUTANEOUS | Status: DC | PRN

## 2021-02-12 MED ORDER — OXYTOCIN-SODIUM CHLORIDE 30-0.9 UT/500ML-% IV SOLN
INTRAVENOUS | Status: DC | PRN
  Administered 2021-02-12: 300 mL via INTRAVENOUS

## 2021-02-12 MED ORDER — KETOROLAC TROMETHAMINE 30 MG/ML IJ SOLN: 30.0000 mg | Freq: Four times a day (QID) | INTRAMUSCULAR | Status: AC | PRN

## 2021-02-12 MED ORDER — SIMETHICONE 80 MG PO CHEW
80.0000 mg | CHEWABLE_TABLET | ORAL | Status: DC | PRN
  Administered 2021-02-14: 80 mg via ORAL
  Filled 2021-02-12: qty 1

## 2021-02-12 MED ORDER — OXYCODONE HCL 5 MG PO TABS: 5.0000 mg | ORAL_TABLET | Freq: Once | ORAL | Status: DC | PRN

## 2021-02-12 MED ORDER — SODIUM CHLORIDE 0.9 % IV SOLN: 500.0000 mg | INTRAVENOUS | Status: DC

## 2021-02-12 MED ORDER — PRENATAL MULTIVITAMIN CH
1.0000 | ORAL_TABLET | Freq: Every day | ORAL | Status: DC
  Administered 2021-02-13: 1 via ORAL
  Filled 2021-02-12: qty 1

## 2021-02-12 MED ORDER — DIPHENHYDRAMINE HCL 25 MG PO CAPS: 25.0000 mg | ORAL_CAPSULE | ORAL | Status: DC | PRN

## 2021-02-12 MED ORDER — OXYCODONE-ACETAMINOPHEN 5-325 MG PO TABS: 2.0000 | ORAL_TABLET | ORAL | Status: DC | PRN

## 2021-02-12 MED ORDER — CLINDAMYCIN PHOSPHATE 900 MG/50ML IV SOLN
900.0000 mg | Freq: Three times a day (TID) | INTRAVENOUS | Status: AC
  Administered 2021-02-12 – 2021-02-13 (×3): 900 mg via INTRAVENOUS
  Filled 2021-02-12 (×3): qty 50

## 2021-02-12 MED ORDER — LIDOCAINE HCL (PF) 1 % IJ SOLN: 30.0000 mL | INTRAMUSCULAR | Status: DC | PRN

## 2021-02-12 MED ORDER — FENTANYL CITRATE (PF) 100 MCG/2ML IJ SOLN
INTRAMUSCULAR | Status: DC | PRN
  Administered 2021-02-12 (×2): 25 ug via INTRAVENOUS

## 2021-02-12 MED ORDER — ACETAMINOPHEN 325 MG PO TABS: 650.0000 mg | ORAL_TABLET | ORAL | Status: DC | PRN

## 2021-02-12 MED ORDER — LACTATED RINGERS IV BOLUS
1000.0000 mL | Freq: Once | INTRAVENOUS | Status: AC
  Administered 2021-02-12: 1000 mL via INTRAVENOUS

## 2021-02-12 MED ORDER — KETOROLAC TROMETHAMINE 30 MG/ML IJ SOLN
30.0000 mg | Freq: Four times a day (QID) | INTRAMUSCULAR | Status: AC | PRN
  Administered 2021-02-12: 30 mg via INTRAVENOUS

## 2021-02-12 MED ORDER — TERBUTALINE SULFATE 1 MG/ML IJ SOLN
0.2500 mg | Freq: Once | INTRAMUSCULAR | Status: AC | PRN
  Administered 2021-02-12: 0.25 mg via SUBCUTANEOUS
  Filled 2021-02-12: qty 1

## 2021-02-12 MED ORDER — SODIUM CHLORIDE 0.9 % IV SOLN
1.0000 g | INTRAVENOUS | Status: DC
  Administered 2021-02-12 (×2): 1 g via INTRAVENOUS
  Filled 2021-02-12 (×2): qty 1000

## 2021-02-12 MED ORDER — MEPERIDINE HCL 25 MG/ML IJ SOLN: 6.2500 mg | INTRAMUSCULAR | Status: DC | PRN

## 2021-02-12 MED ORDER — TRANEXAMIC ACID-NACL 1000-0.7 MG/100ML-% IV SOLN
INTRAVENOUS | Status: DC | PRN
  Administered 2021-02-12: 1000 mg via INTRAVENOUS

## 2021-02-12 SURGICAL SUPPLY — 36 items
BENZOIN TINCTURE PRP APPL 2/3 (GAUZE/BANDAGES/DRESSINGS) ×2 IMPLANT
CHLORAPREP W/TINT 26ML (MISCELLANEOUS) ×2 IMPLANT
CLAMP CORD UMBIL (MISCELLANEOUS) IMPLANT
CLOSURE STERI STRIP 1/2 X4 (GAUZE/BANDAGES/DRESSINGS) ×2 IMPLANT
CLOTH BEACON ORANGE TIMEOUT ST (SAFETY) ×2 IMPLANT
DRSG OPSITE POSTOP 4X10 (GAUZE/BANDAGES/DRESSINGS) ×2 IMPLANT
ELECT REM PT RETURN 9FT ADLT (ELECTROSURGICAL) ×2
ELECTRODE REM PT RTRN 9FT ADLT (ELECTROSURGICAL) ×1 IMPLANT
EXTRACTOR VACUUM M CUP 4 TUBE (SUCTIONS) IMPLANT
GLOVE BIOGEL PI IND STRL 7.0 (GLOVE) ×2 IMPLANT
GLOVE BIOGEL PI IND STRL 7.5 (GLOVE) ×2 IMPLANT
GLOVE BIOGEL PI INDICATOR 7.0 (GLOVE) ×2
GLOVE BIOGEL PI INDICATOR 7.5 (GLOVE) ×2
GLOVE ECLIPSE 7.5 STRL STRAW (GLOVE) ×2 IMPLANT
GOWN STRL REUS W/TWL LRG LVL3 (GOWN DISPOSABLE) ×6 IMPLANT
KIT ABG SYR 3ML LUER SLIP (SYRINGE) IMPLANT
NEEDLE HYPO 25X5/8 SAFETYGLIDE (NEEDLE) ×2 IMPLANT
NS IRRIG 1000ML POUR BTL (IV SOLUTION) ×2 IMPLANT
PACK C SECTION WH (CUSTOM PROCEDURE TRAY) ×2 IMPLANT
PAD ABD 7.5X8 STRL (GAUZE/BANDAGES/DRESSINGS) ×2 IMPLANT
PAD OB MATERNITY 4.3X12.25 (PERSONAL CARE ITEMS) ×2 IMPLANT
PENCIL SMOKE EVAC W/HOLSTER (ELECTROSURGICAL) ×2 IMPLANT
RTRCTR C-SECT PINK 25CM LRG (MISCELLANEOUS) ×2 IMPLANT
SPONGE GAUZE 4X4 12PLY STER LF (GAUZE/BANDAGES/DRESSINGS) ×4 IMPLANT
STRIP CLOSURE SKIN 1/2X4 (GAUZE/BANDAGES/DRESSINGS) ×2 IMPLANT
SUT PLAIN 2 0 (SUTURE) ×1
SUT PLAIN ABS 2-0 CT1 27XMFL (SUTURE) ×1 IMPLANT
SUT VIC AB 0 CT1 36 (SUTURE) ×2 IMPLANT
SUT VIC AB 0 CTX 36 (SUTURE) ×3
SUT VIC AB 0 CTX36XBRD ANBCTRL (SUTURE) ×3 IMPLANT
SUT VIC AB 2-0 CT1 27 (SUTURE) ×1
SUT VIC AB 2-0 CT1 TAPERPNT 27 (SUTURE) ×1 IMPLANT
SUT VIC AB 4-0 KS 27 (SUTURE) ×4 IMPLANT
TOWEL OR 17X24 6PK STRL BLUE (TOWEL DISPOSABLE) ×2 IMPLANT
TRAY FOLEY W/BAG SLVR 14FR LF (SET/KITS/TRAYS/PACK) ×2 IMPLANT
WATER STERILE IRR 1000ML POUR (IV SOLUTION) ×2 IMPLANT

## 2021-02-12 NOTE — Op Note (Signed)
Heather Welch PROCEDURE DATE: 02/12/2021  PREOPERATIVE DIAGNOSIS: Intrauterine pregnancy at  [redacted]w[redacted]d weeks gestation; non-reassuring fetal status  POSTOPERATIVE DIAGNOSIS: The same  PROCEDURE: Repeat Low Transverse Cesarean Section  SURGEON:  Dr. Shonna Chock   ASSISTANT: Dr Leticia Penna   INDICATIONS: Heather Welch is a 35 y.o. [id number] at [redacted]w[redacted]d scheduled for cesarean section secondary to non-reassuring fetal status.  The risks of cesarean section discussed with the patient included but were not limited to: bleeding which may require transfusion or reoperation; infection which may require antibiotics; injury to bowel, bladder, ureters or other surrounding organs; injury to the fetus; need for additional procedures including hysterectomy in the event of a life-threatening hemorrhage; placental abnormalities wth subsequent pregnancies, incisional problems, thromboembolic phenomenon and other postoperative/anesthesia complications. The patient concurred with the proposed plan, giving informed written consent for the procedure.    FINDINGS:  Viable female infant in cephalic presentation, loose nuchal cord present.  Apgars 5 and 9, weight pending.  Clear amniotic fluid.  Intact placenta, three vessel cord.  Normal uterus, fallopian tubes and ovaries bilaterally.  ANESTHESIA:    Spinal INTRAVENOUS FLUIDS: 1,400 ml ESTIMATED BLOOD LOSS: 449 ml URINE OUTPUT:  600 ml SPECIMENS: Placenta sent to pathology COMPLICATIONS: None immediate  PROCEDURE IN DETAIL:  The patient received intravenous antibiotics and had sequential compression devices applied to her lower extremities while in the preoperative area.  She was then taken to the operating room where spinal anesthesia was administered (epidural anesthesia was dosed up to surgical level) and was found to be adequate. She was then placed in a dorsal supine position with a leftward tilt, and prepped and draped in a sterile manner.  A foley catheter was placed  into her bladder and attached to constant gravity, which drained clear fluid throughout.  After an adequate timeout was performed, a Pfannenstiel skin incision was made with scalpel and carried through to the underlying layer of fascia. The fascia was incised in the midline and this incision was extended bilaterally using the Mayo scissors. Kocher clamps were applied to the superior aspect of the fascial incision and the underlying rectus muscles were dissected off bluntly. A similar process was carried out on the inferior aspect of the facial incision. The rectus muscles were separated in the midline bluntly and the peritoneum was entered bluntly. An Alexis retractor was placed to aid in visualization of the uterus.  Attention was turned to the lower uterine segment where a transverse hysterotomy was made with a scalpel and extended bilaterally bluntly. The infant was successfully delivered, and cord was clamped and cut and infant was handed over to awaiting neonatology team. Uterine massage was then administered and the placenta delivered intact with three-vessel cord. The uterus was then cleared of clot and debris.  The hysterotomy was closed with 0 Vicryl in a running locked fashion, and an imbricating layer was also placed with a 0 Vicryl. Overall, excellent hemostasis was noted. The abdomen and the pelvis were cleared of all clot and debris and the Jon Gills was removed. Hemostasis was confirmed on all surfaces.  The peritoneum was reapproximated using 2-0 vicryl running stitches. The fascia was then closed using 0 Vicryl in a running fashion. The subcutaneous layer was reapproximated with plain gut and the skin was closed with 4-0 vicryl. The patient tolerated the procedure well. Sponge, lap, instrument and needle counts were correct x 2. She was taken to the recovery room in stable condition.    Heather Stack, DO 02/12/2021 4:07 PM

## 2021-02-12 NOTE — Progress Notes (Signed)
Labor Progress Note Heather Welch is a 35 y.o. G2P1001 at [redacted]w[redacted]d presented for SOL S: Feeling pressure more strongly now. Becoming increasingly uncomfortable and feeling as though she needs to have a bowel movement  O:  BP 110/66   Pulse 94   Temp 98.9 F (37.2 C)   Resp 16   Ht 5' (1.524 m)   Wt 80.3 kg   LMP 05/24/2020   SpO2 100%   Breastfeeding Yes   BMI 34.57 kg/m  EFM: 125/moderate/variable  CVE: Dilation: 8 Effacement (%): 80 Cervical Position: Middle Station: -2 Presentation: Vertex Exam by:: Andrey Campanile   A&P: 35 y.o. G2P1001 [redacted]w[redacted]d  #Labor: Progressing well, has changed from 8 to 9. Feeling more pressure. Starting amnioinfusion for variable decels #Pain: Epidural in place #FWB: Cat II- will start amnioinfusion #GBS positive (PCN)   Dorothyann Gibbs, MD 12:20 PM

## 2021-02-12 NOTE — H&P (Signed)
OBSTETRIC ADMISSION HISTORY AND PHYSICAL  Heather Welch is a 35 y.o. female G2P1001 with IUP at [redacted]w[redacted]d by [place] presenting for spontaneous onset of labor. Her contractions started around 9 PM last evening and have gotten stronger and closer together since. She reports +FMs, no LOF, no VB, no blurry vision, headaches, peripheral edema or RUQ pain.  She plans on breast feeding. She is planning to use OCPs for birth control after delivery.  She received her prenatal care at [facility].    Dating: By [place] --->  Estimated Date of Delivery: 02/15/21  Sono:   @[redacted]w[redacted]d , CWD, normal anatomy, cephalic presentation, posterior placental lie, 3006g, 61% EFW  Prenatal History/Complications:  Hx of CS for fetal bradycardia GBS positive   Past Medical History: Past Medical History:  Diagnosis Date   H/O emergency cesarean section    History of chicken pox    Torsion of accessory fallopian tube     Past Surgical History: Past Surgical History:  Procedure Laterality Date   CESAREAN SECTION     LEFT OOPHORECTOMY Left 2011   torsion / cyst    Obstetrical History: OB History     Gravida  2   Para  1   Term  1   Preterm      AB      Living  1      SAB      IAB      Ectopic      Multiple      Live Births  1           Social History Social History   Socioeconomic History   Marital status: Single    Spouse name: Not on file   Number of children: Not on file   Years of education: Not on file   Highest education level: Not on file  Occupational History   Not on file  Tobacco Use   Smoking status: Never   Smokeless tobacco: Never  Vaping Use   Vaping Use: Never used  Substance and Sexual Activity   Alcohol use: Yes    Comment: 1-2 drinks [place] every weekend   Drug use: No   Sexual activity: Yes    Birth control/protection: Pill  Other Topics Concern   Not on file  Social History Narrative   [employer] technologist   Lives with boyfriend   Bachelors degree in  Biology, wants to apply to nursing school.  Graduated from [employer]   Has a dog who she enjoys, enjoys shopping.     Social Determinants of Health   Financial Resource Strain: Not on file  Food Insecurity: Not on file  Transportation Needs: Not on file  Physical Activity: Not on file  Stress: Not on file  Social Connections: Not on file    Family History: Family History  Problem Relation Age of Onset   Carpal tunnel syndrome Mother    Hypertension Mother    Stroke Father    Hypertension Father    Diabetes Father    Stroke Maternal Grandfather    Hypertension Maternal Grandfather    Kidney disease Maternal Grandfather    Cancer Paternal Grandmother        breast    Allergies: No Known Allergies  Medications Prior to Admission  Medication Sig Dispense Refill Last Dose   Iron, Ferrous Gluconate, 256 (28 Fe) MG TABS Take 1 tablet by mouth every other day. 60 tablet 2 Past Week   Prenatal Vit-Fe Fumarate-FA (PRENATAL VITAMINS PO)  Take by mouth.   02/11/2021     Review of Systems  All systems reviewed and negative except as stated in HPI  Blood pressure 125/79, pulse 95, temperature 98.1 F (36.7 C), temperature source Oral, resp. rate 18, height 5' (1.524 m), weight 80.3 kg, last menstrual period 05/24/2020, SpO2 98 %, currently breastfeeding.  General appearance: alert, cooperative, and no distress Lungs: normal work of breathing on room air  Heart: normal rate, warm and well perfused  Abdomen: soft, non-tender, gravid  Extremities: no LE edema or calf tenderness to palpation   Presentation: cephalic Fetal monitoring: 125 bpm, moderate variability, + accels, early/late decels Uterine activity: Every 2-4 minutes Dilation: 8 Effacement (%): 80 Station: -2 Exam by:: J Mbugua RN   Prenatal labs: ABO, Rh: --/--/A POS (09/07 0246) Antibody: NEG (09/07 0246) Rubella: 9.39 (04/27 1528) RPR: Non Reactive (06/24 0905)  HBsAg: Negative (04/27 1528)  HIV: Non Reactive  (06/24 0905)  GBS: Positive/-- (08/17 1633)  1 hr Glucola normal Genetic screening normal  Anatomy US normal  Prenatal Transfer Tool  Maternal Diabetes: No Genetic Screening: Normal Maternal Ultrasounds/Referrals: Normal Fetal Ultrasounds or other Referrals:  None Maternal Substance Abuse:  No Significant Maternal Medications:  None Significant Maternal Lab Results: Group B Strep positive  Results for orders placed or performed during the hospital encounter of 02/12/21 (from the past 24 hour(s))  Type and screen [facility]   Collection Time: 02/12/21  2:46 AM  Result Value Ref Range   ABO/RH(D) A POS    Antibody Screen NEG    Sample Expiration      02/15/2021,2359 Performed at [facility], [address], [place], [place] [address]   CBC   Collection Time: 02/12/21  2:47 AM  Result Value Ref Range   WBC 7.5 4.0 - 10.5 K/uL   RBC 4.41 3.87 - 5.11 MIL/uL   Hemoglobin 11.7 (L) 12.0 - 15.0 g/dL   HCT 91.4 78.2 - 95.6 %   MCV 83.0 80.0 - 100.0 fL   MCH 26.5 26.0 - 34.0 pg   MCHC 32.0 30.0 - 36.0 g/dL   RDW 21.3 08.6 - 57.8 %   Platelets 125 (L) 150 - 400 K/uL   nRBC 0.0 0.0 - 0.2 %  Resp Panel by RT-PCR (Flu A&B, Covid) Nasopharyngeal Swab   Collection Time: 02/12/21  3:24 AM   Specimen: Nasopharyngeal Swab; Nasopharyngeal(NP) swabs in vial transport medium  Result Value Ref Range   SARS Coronavirus 2 by RT PCR NEGATIVE NEGATIVE   Influenza A by PCR NEGATIVE NEGATIVE   Influenza B by PCR NEGATIVE NEGATIVE    Patient Active Problem List   Diagnosis Date Noted   Indication for care in labor or delivery 02/12/2021   Positive GBS test 01/27/2021   Supervision of other normal pregnancy, antepartum 10/02/2020   History of C-section 10/02/2020   Anxiety state 11/11/2015   Headache 11/11/2015    Assessment/Plan:  [name] is a 35 y.o. G2P1001 at [redacted]w[redacted]d here for SOL.  #Labor: SVE now 8/80/-2. Contracting regularly. Will continue expectant  management. Anticipate SVD.  #Pain: Epidural in place  #FWB: Cat 2 due to intermittent variable and late decelerations. Reassuring variability. Will continue to monitor closely.  #ID:  GBS positive > Amp due to rapidly progressing labor  #MOF: Breast #MOC: OCPs  #Circ:  Yes  #TOLAC: Hx of CS due to fetal bradycardia. Progressed to about 8 cm before needing stat CS. TOLAC consent signed 8/4 after  discussion of risks/benefits of TOLAC versus repeat CS. Reviewed these risks again on admission. Patient agrees to continue with TOLAC.   Worthy Rancher, MD  02/12/2021, 4:52 AM

## 2021-02-12 NOTE — Anesthesia Preprocedure Evaluation (Deleted)
                                    Anesthesia Evaluation  Patient identified by MRN, date of birth, ID band Patient awake    Reviewed: Allergy & Precautions, Patient's Chart, lab work & pertinent test results  Airway Mallampati: II  TM Distance: >3 FB Neck ROM: Full    Dental no notable dental hx. (+) Teeth Intact   Pulmonary neg pulmonary ROS,  Pulmonary exam normal breath sounds clear to auscultation       Cardiovascular negative cardio ROS Normal cardiovascular exam Rhythm:Regular Rate:Normal     Neuro/Psych  Headaches, Anxiety    GI/Hepatic Neg liver ROS, GERD  ,  Endo/Other  Obesity  Renal/GU negative Renal ROS  negative genitourinary   Musculoskeletal negative musculoskeletal ROS (+)   Abdominal (+) + obese,   Peds  Hematology  (+) anemia ,   Anesthesia Other Findings   Reproductive/Obstetrics (+) Pregnancy Hx/o previous C/Section for Fetal intolerance                             Anesthesia Physical Anesthesia Plan  ASA: 2  Anesthesia Plan: Epidural   Post-op Pain Management:    Induction:   PONV Risk Score and Plan:   Airway Management Planned: Natural Airway  Additional Equipment:   Intra-op Plan:   Post-operative Plan:   Informed Consent: I have reviewed the patients History and Physical, chart, labs and discussed the procedure including the risks, benefits and alternatives for the proposed anesthesia with the patient or authorized representative who has indicated his/her understanding and acceptance.       Plan Discussed with: Anesthesiologist  Anesthesia Plan Comments:   Pt now with fetal intolerance to labor- to OR for repeat C/S under epidural.      Anesthesia Quick Evaluation   (Copied from original note by Dr. Malen Gauze which was deleted in error)

## 2021-02-12 NOTE — Transfer of Care (Signed)
Immediate Anesthesia Transfer of Care Note  Patient: Heather Welch  Procedure(s) Performed: CESAREAN SECTION  Patient Location: PACU  Anesthesia Type:Epidural  Level of Consciousness: awake, alert  and oriented  Airway & Oxygen Therapy: Patient Spontanous Breathing  Post-op Assessment: Report given to RN and Post -op Vital signs reviewed and stable  Post vital signs: Reviewed and stable  Last Vitals:  Vitals Value Taken Time  BP 93/48 02/12/21 1608  Temp    Pulse 116 02/12/21 1611  Resp 20 02/12/21 1611  SpO2 95 % 02/12/21 1611  Vitals shown include unvalidated device data.  Last Pain:  Vitals:   02/12/21 0730  TempSrc: Oral  PainSc:          Complications: No notable events documented.

## 2021-02-12 NOTE — Anesthesia Preprocedure Evaluation (Signed)
Anesthesia Evaluation  Patient identified by MRN, date of birth, ID band Patient awake    Reviewed: Allergy & Precautions, Patient's Chart, lab work & pertinent test results  History of Anesthesia Complications Negative for: history of anesthetic complications  Airway Mallampati: II  TM Distance: >3 FB Neck ROM: Full    Dental   Pulmonary neg pulmonary ROS,    Pulmonary exam normal        Cardiovascular negative cardio ROS Normal cardiovascular exam     Neuro/Psych  Headaches, Anxiety    GI/Hepatic Neg liver ROS, GERD  ,  Endo/Other  negative endocrine ROS  Renal/GU negative Renal ROS  negative genitourinary   Musculoskeletal negative musculoskeletal ROS (+)   Abdominal   Peds  Hematology  (+) anemia ,   Anesthesia Other Findings   Reproductive/Obstetrics (+) Pregnancy (Hx/o previous C/Section for Fetal intolerance )                             Anesthesia Physical Anesthesia Plan  ASA: 2 and emergent  Anesthesia Plan: Epidural   Post-op Pain Management:    Induction:   PONV Risk Score and Plan: Ondansetron and Treatment may vary due to age or medical condition  Airway Management Planned: Natural Airway  Additional Equipment: None  Intra-op Plan:   Post-operative Plan:   Informed Consent: I have reviewed the patients History and Physical, chart, labs and discussed the procedure including the risks, benefits and alternatives for the proposed anesthesia with the patient or authorized representative who has indicated his/her understanding and acceptance.       Plan Discussed with:   Anesthesia Plan Comments: (Initially attempted TOLAC, now to OR for fetal intolerance to labor. Labor epidural in place and functioning well. )        Anesthesia Quick Evaluation

## 2021-02-12 NOTE — Progress Notes (Signed)
Labor Progress Note Airis Barbee is a 35 y.o. G2P1001 at [redacted]w[redacted]d presented for SOL S: Remains comfortable. IUPC tracing with inadequate contractions.   O:  BP 109/67   Pulse 82   Temp 97.8 F (36.6 C) (Oral)   Resp 16   Ht 5' (1.524 m)   Wt 80.3 kg   LMP 05/24/2020   SpO2 100%   Breastfeeding Yes   BMI 34.57 kg/m  EFM: 130/moderate/intermittent variables  CVE: Dilation: 8 Effacement (%): 80 Station: -1 Presentation: Vertex Exam by:: Dr. Marisue Humble   A&P: 35 y.o. G2P1001 [redacted]w[redacted]d  #Labor: Will start pitocin for inadequate contractions #Pain: Epidural in place #FWB: Cat II, position changes, will consider amnioinfusion if no improvent #GBS positive (PCN)   Dorothyann Gibbs, MD 10:39 AM

## 2021-02-12 NOTE — Discharge Summary (Addendum)
Postpartum Discharge Summary    Patient Name: Heather Welch DOB: 1986-01-03 MRN: [id number]  Date of admission: 02/12/2021 Delivery date:02/12/2021  Delivering provider: Laurey Arrow [place]  Date of discharge: 02/14/2021  Admitting diagnosis: Indication for care in labor or delivery [O75.9] Intrauterine pregnancy: [redacted]w[redacted]d    Secondary diagnosis:  Active Problems:   Indication for care in labor or delivery Non-reassuring fetal heart tones  Additional problems: Post-operative fever    Discharge diagnosis: Term Pregnancy Delivered                                              Post partum procedures: none Augmentation: AROM and Pitocin Complications: Intrauterine Inflammation or infection (Chorioamniotis)  Hospital course: Onset of Labor With Unplanned C/S   35y.o. yo [id number] admitted in Active Labor on 02/12/2021. Patient had a labor course significant for SOL and 8cm prior to arrival on L&D, however maintained 8cm for quite some time. Repeatedly continued to have recurrent NRFHTs despite conservative interventions, thus decided to proceed with urgent C/S. The patient went for cesarean section due to Non-Reassuring FHR. Delivery details as follows: Membrane Rupture Time/Date: 9:02 AM ,02/12/2021   Delivery Method:C-Section, Low Transverse  Details of operation can be found in separate operative note. Patient had an uncomplicated postpartum course.  She is ambulating,tolerating a regular diet, passing flatus, and urinating well.  Patient is discharged home in stable condition 02/14/21.  Newborn Data: Birth date:02/12/2021  Birth time:3:00 PM  Gender:Female  Living status:Living  Apgars:5 ,9  Weight:3285 g   Magnesium Sulfate received: No BMZ received: No Rhophylac:No MMR:No T-DaP:Given prenatally Flu: No Transfusion:No  Physical exam  Vitals:   02/13/21 0701 02/13/21 0702 02/13/21 2205 02/14/21 0557  BP: 101/79 100/71 112/69 (!) 98/59  Pulse: 60 68 74 72  Resp: _0 Temp:   98.1 F (36.7 C) 98 F (36.7 C)  TempSrc:   Oral Oral  SpO2: 100% 100% 98% 97%  Weight:      Height:       General: alert, cooperative, and no distress Lochia: appropriate Uterine Fundus: firm Incision: Dressing is clean, dry, and intact DVT Evaluation: No evidence of DVT seen on physical exam. Labs: Lab Results  Component Value Date   WBC 7.5 02/12/2021   HGB 8.3 (L) 02/13/2021   HCT 36.6 02/12/2021   MCV 83.0 02/12/2021   PLT 125 (L) 02/12/2021   CMP Latest Ref Rng & Units 05/23/2018  Glucose 70 - 99 mg/dL 91  BUN 6 - 23 mg/dL 18  Creatinine 0.40 - 1.20 mg/dL 1.05  Sodium 135 - 145 mEq/L 143  Potassium 3.5 - 5.1 mEq/L 5.1  Chloride 96 - 112 mEq/L 105  CO2 19 - 32 mEq/L 29  Calcium 8.4 - 10.5 mg/dL 9.8  Total Protein 6.0 - 8.3 g/dL -  Total Bilirubin 0.2 - 1.2 mg/dL -  Alkaline Phos 39 - 117 U/L -  AST 0 - 37 U/L -  ALT 0 - 35 U/L -   Edinburgh Score: Edinburgh Postnatal Depression Scale Screening Tool 02/12/2021  I have been able to laugh and see the funny side of things. 0  I have looked forward with enjoyment to things. 0  I have blamed myself unnecessarily when things went wrong. 0  I have been anxious or worried for no good  reason. 0  I have felt scared or panicky for no good reason. 0  Things have been getting on top of me. 0  I have been so unhappy that I have had difficulty sleeping. 0  I have felt sad or miserable. 0  I have been so unhappy that I have been crying. 0  The thought of harming myself has occurred to me. 0  Edinburgh Postnatal Depression Scale Total 0     After visit meds:  Allergies as of 02/14/2021   No Known Allergies      Medication List     TAKE these medications    acetaminophen 500 MG tablet Commonly known as: TYLENOL Take 500 mg by mouth every 6 (six) hours as needed for mild pain.   calcium carbonate 500 MG chewable tablet Commonly known as: TUMS - dosed in mg elemental calcium Chew 3 tablets by mouth daily  as needed for indigestion or heartburn.   docusate sodium 100 MG capsule Commonly known as: Colace Take 1 capsule (100 mg total) by mouth 2 (two) times daily as needed for mild constipation.   ibuprofen 600 MG tablet Commonly known as: ADVIL Take 1 tablet (600 mg total) by mouth every 6 (six) hours as needed.   Iron (Ferrous Gluconate) 256 (28 Fe) MG Tabs Take 1 tablet by mouth every other day.   norethindrone 0.35 MG tablet Commonly known as: Ortho Micronor Take 1 tablet (0.35 mg total) by mouth daily.   oxyCODONE 5 MG immediate release tablet Commonly known as: Oxy IR/ROXICODONE Take 1 tablet (5 mg total) by mouth every 6 (six) hours as needed for up to 5 days for moderate pain or severe pain.   PRENATAL VITAMINS PO Take by mouth.   senna 8.6 MG Tabs tablet Commonly known as: SENOKOT Take 1 tablet by mouth daily as needed for mild constipation.         Discharge home in stable condition Infant Feeding: Breast Infant Disposition:home with mother Discharge instruction: per After Visit Summary and Postpartum booklet. Activity: Advance as tolerated. Pelvic rest for 6 weeks.  Diet: routine diet Future Appointments: Future Appointments  Date Time Provider [place]  02/27/2021  1:30 PM Truett Mainland, DO [facility] None  03/28/2021 10:35 AM Nehemiah Settle, Tanna Savoy, DO [facility] None   Follow up Visit:  [place] Follow up in 5 week(s).   Specialty: Obstetrics and Gynecology Contact information: [place] [id number] [phone]                Message sent to [facility] by Dr Higinio Plan on 02/12/2021:   Please schedule this patient for a In person postpartum visit in 4 weeks with the following provider: Any provider. Additional Postpartum F/U:Incision check 1 week  Low risk pregnancy complicated by:  None Delivery mode:  C-Section, Low Transverse   Anticipated Birth Control:  POPs   02/14/2021 Annalee Genta, DO

## 2021-02-12 NOTE — Progress Notes (Signed)
Labor Progress Note Heather Welch is a 35 y.o. G2P1001 at [redacted]w[redacted]d presented for SOL S: Comfortable with epidural in place. Tolerated AROM and IUPC placement without issue. Patient prefers to defer FSE placement at this time  O:  BP 122/83   Pulse 92   Temp 97.8 F (36.6 C) (Oral)   Resp 16   Ht 5' (1.524 m)   Wt 80.3 kg   LMP 05/24/2020   SpO2 99%   Breastfeeding Yes   BMI 34.57 kg/m  EFM: 125/moderate/accelerations present   CVE: Dilation: 8 Effacement (%): 80 Station: -1 Presentation: Vertex Exam by:: Dr. Marisue Humble   A&P: 35 y.o. G2P1001 [redacted]w[redacted]d here for SOL #Labor: Progressing well, has not made change since last check. AROM with moderate meconium.  Will plan to have NICU present for delivery. Inserted IUPC to ensure adequacy of contractions. Titrate Pitocin appropriately.  #Pain: Epidural in place #FWB: Cat I  #GBS positive (PCN)   Dorothyann Gibbs, MD 9:20 AM

## 2021-02-12 NOTE — Anesthesia Preprocedure Evaluation (Deleted)
Anesthesia Evaluation  Patient identified by MRN, date of birth, ID band Patient awake    Reviewed: Allergy & Precautions, Patient's Chart, lab work & pertinent test results  Airway Mallampati: II  TM Distance: >3 FB Neck ROM: Full    Dental no notable dental hx. (+) Teeth Intact   Pulmonary neg pulmonary ROS,    Pulmonary exam normal breath sounds clear to auscultation       Cardiovascular negative cardio ROS Normal cardiovascular exam Rhythm:Regular Rate:Normal     Neuro/Psych  Headaches, Anxiety    GI/Hepatic Neg liver ROS, GERD  ,  Endo/Other  Obesity  Renal/GU negative Renal ROS  negative genitourinary   Musculoskeletal negative musculoskeletal ROS (+)   Abdominal (+) + obese,   Peds  Hematology  (+) anemia ,   Anesthesia Other Findings   Reproductive/Obstetrics (+) Pregnancy Hx/o previous C/Section for Fetal intolerance                             Anesthesia Physical Anesthesia Plan  ASA: 2  Anesthesia Plan: Epidural   Post-op Pain Management:    Induction:   PONV Risk Score and Plan:   Airway Management Planned: Natural Airway  Additional Equipment:   Intra-op Plan:   Post-operative Plan:   Informed Consent: I have reviewed the patients History and Physical, chart, labs and discussed the procedure including the risks, benefits and alternatives for the proposed anesthesia with the patient or authorized representative who has indicated his/her understanding and acceptance.       Plan Discussed with: Anesthesiologist  Anesthesia Plan Comments:         Anesthesia Quick Evaluation

## 2021-02-12 NOTE — Progress Notes (Signed)
Labor Progress Note Heather Welch is a 35 y.o. G2P1001 at [redacted]w[redacted]d who presented for SOL.   S: Resting, comfortable after epidural placement.  O:  BP 112/69   Pulse 93   Temp 98.1 F (36.7 C) (Oral)   Resp 17   Ht 5' (1.524 m)   Wt 80.3 kg   LMP 05/24/2020   SpO2 98%   Breastfeeding Yes   BMI 34.57 kg/m  EFM: Baseline 120 bpm/moderate variability/+ accels/late, prolonged decel   CVE: Dilation: 8 Effacement (%): 80 Station: -1 Presentation: Vertex Exam by:: Dr. Mathis Fare   A&P: 35 y.o. G2P1001 [redacted]w[redacted]d - SOL, TOLAC.  #Labor: Patient noted to be in prolonged decel. Assessed patient at bedside. SVE similar to prior. Fluid bolus given in addition to position change with improvement in FHT. Contractions spacing somewhat, every 5-6 minutes now. Will hold off on additional augmentation while baby recovers. Will consider AROM if FHT remains stable.  #Pain: Epidural  #FWB: Cat 2 tracing due to intermittent late decels with recent prolonged decel. Reassuring variability and continues to have intermittent accels. Will closely monitor.  #GBS positive - Amp  Worthy Rancher, MD 6:00 AM

## 2021-02-12 NOTE — Anesthesia Procedure Notes (Signed)
Epidural Patient location during procedure: OB Start time: 02/12/2021 4:38 AM End time: 02/12/2021 4:46 AM  Staffing Anesthesiologist: Mal Amabile, MD Performed: anesthesiologist   Preanesthetic Checklist Completed: patient identified, IV checked, site marked, risks and benefits discussed, surgical consent, monitors and equipment checked, pre-op evaluation and timeout performed  Epidural Patient position: sitting Prep: DuraPrep and site prepped and draped Patient monitoring: continuous pulse ox and blood pressure Approach: midline Location: L4-L5 Injection technique: LOR air  Needle:  Needle type: Tuohy  Needle gauge: 17 G Needle length: 9 cm and 9 Needle insertion depth: 6 cm Catheter type: closed end flexible Catheter size: 19 Gauge Catheter at skin depth: 11 cm Test dose: negative and Other  Assessment Events: blood not aspirated, injection not painful, no injection resistance, no paresthesia and negative IV test  Additional Notes Patient identified. Risks and benefits discussed including failed block, incomplete  Pain control, post dural puncture headache, nerve damage, paralysis, blood pressure Changes, nausea, vomiting, reactions to medications-both toxic and allergic and post Partum back pain. All questions were answered. Patient expressed understanding and wished to proceed. Sterile technique was used throughout procedure. Epidural site was Dressed with sterile barrier dressing. No paresthesias, signs of intravascular injection Or signs of intrathecal spread were encountered.  Patient was more comfortable after the epidural was dosed. Please see RN's note for documentation of vital signs and FHR which are stable. Reason for block:procedure for pain

## 2021-02-12 NOTE — Progress Notes (Addendum)
Patient is a 35 yo G2P1 at 39+4, here with SOL, this is a TOLAC. I was called to the room for prolonged deceleration. Patient has been 8 cm since this morning around 5 AM. At around 10:30 had AROM and IUPC placed and pit started. Patient re-checked prior to my arrival, called 9-10, and trialed pushing with prolonged decel lasting about 8 minutes, to the 90s. When I arrived I placed an FSE and checked the patient, found to have anterior lip, zero station. There is also bloody show reportedly greater than earlier this morning. Pit stopped and terbutaline given as well as fluid bolus. Patient has amnioinfusion running for previous variables. After repositioning and terbutaline category 1 tracing. No sig abdominal pain or change in station to suggest uterine rupture, though that and abruption remain on ddx. Given current category 1 tracing, shared decision with patient to manage expectantly for the next 30 minutes or so, will then re-check cervix and if complete attempt pushing again, with pitocin augmentation as needed. Would proceed to c/s if further significant decelerations or worsening bleeding.   Adendum 14:13 After terbutaline category 1 tracing. Then as contraction frequency/intensity increased, developed recurrent variables. Checked and found to have anterior lip. Attempted pushing but unable to push past and with pushing deep variables. Discussed risks/benefits of proceeding with cesarean section and patient elects to proceed with cesarean section for fetal indications. Terbutaline given. Will proceed urgently. The risks of cesarean section were discussed with the patient including but were not limited to: bleeding which may require transfusion or reoperation; infection which may require antibiotics; injury to bowel, bladder, ureters or other surrounding organs; injury to the fetus; need for additional procedures including hysterectomy in the event of a life-threatening hemorrhage; placental abnormalities  wth subsequent pregnancies, incisional problems, thromboembolic phenomenon and other postoperative/anesthesia complications.  Anesthesia and OR aware.  Preoperative prophylactic antibiotics and SCDs ordered on call to the OR.  To OR when ready.

## 2021-02-12 NOTE — Anesthesia Postprocedure Evaluation (Signed)
Anesthesia Post Note  Patient: Catlynn Grondahl  Procedure(s) Performed: CESAREAN SECTION     Patient location during evaluation: PACU Anesthesia Type: Epidural Level of consciousness: oriented and awake and alert Pain management: pain level controlled Vital Signs Assessment: post-procedure vital signs reviewed and stable Respiratory status: spontaneous breathing, respiratory function stable and nonlabored ventilation Cardiovascular status: blood pressure returned to baseline and stable Postop Assessment: no headache, no backache, no apparent nausea or vomiting and epidural receding Anesthetic complications: no   No notable events documented.  Last Vitals:  Vitals:   02/12/21 1645 02/12/21 1700  BP: 118/79 121/82  Pulse: (!) 111 (!) 102  Resp: (!) 23 20  Temp:    SpO2: 92% 95%    Last Pain:  Vitals:   02/12/21 0730  TempSrc: Oral  PainSc:    Pain Goal:                Epidural/Spinal Function Cutaneous sensation: Able to Wiggle Toes (02/12/21 1700), Patient able to flex knees: Yes (02/12/21 1700), Patient able to lift hips off bed: Yes (02/12/21 1700), Back pain beyond tenderness at insertion site: No (02/12/21 1700), Progressively worsening motor and/or sensory loss: No (02/12/21 1700), Bowel and/or bladder incontinence post epidural: No (02/12/21 1700)  Lucretia Kern

## 2021-02-13 ENCOUNTER — Encounter (HOSPITAL_COMMUNITY): Payer: Self-pay | Admitting: Obstetrics and Gynecology

## 2021-02-13 ENCOUNTER — Encounter: Payer: Managed Care, Other (non HMO) | Admitting: Family Medicine

## 2021-02-13 LAB — HEMOGLOBIN: Hemoglobin: 8.3 g/dL — ABNORMAL LOW (ref 12.0–15.0)

## 2021-02-13 MED ORDER — IBUPROFEN 600 MG PO TABS
600.0000 mg | ORAL_TABLET | Freq: Four times a day (QID) | ORAL | Status: DC
  Administered 2021-02-13 – 2021-02-14 (×4): 600 mg via ORAL
  Filled 2021-02-13 (×5): qty 1

## 2021-02-13 MED ORDER — SODIUM CHLORIDE 0.9 % IV SOLN
500.0000 mg | Freq: Once | INTRAVENOUS | Status: AC
  Administered 2021-02-13: 500 mg via INTRAVENOUS
  Filled 2021-02-13: qty 25

## 2021-02-13 NOTE — Progress Notes (Addendum)
Post Parptum Day 1 Subjective:  Heather Welch is a 35 year-old female G2P2002 delivered at [redacted]w[redacted]d via pLTCS d/t NRFHT. No complaints, up ad lib, voiding, tolerating PO, and + flatus.  Objective: Blood pressure 100/71, pulse 68, temperature 99 F (37.2 C), temperature source Oral, resp. rate 16, height 5' (1.524 m), weight 80.3 kg, last menstrual period 05/24/2020, SpO2 100 %, currently breastfeeding.  Physical Exam:  General: alert, cooperative, appears stated age, and no distress Lochia: appropriate Uterine Fundus: firm Incision: mild strike through on honeycomb dressing, no significant drainage, no significant erythema DVT Evaluation:  No evidence of DVT seen on physical exam. No cords or calf tenderness. No significant calf/ankle edema.  Recent Labs    02/12/21 0247 02/13/21 0533  HGB 11.7* 8.3*  HCT 36.6  --    Assessment/Plan:  Breastfeeding and Circumcision prior to discharge  Heather Welch is a 35 year-old female G2P2002 delivered at [redacted]w[redacted]d via pLTCS d/t NRFHT. Patient had a post-op fever at Tmax 101.3. Started on Gent/Clinda. Patient is improving and afebrile.  #Endometritis  - Afebrile, VSS - Continue Gent/Clinda for 24 hours post-op  #Acute blood loss anemia - Hgb this AM 8.3; down from 11.7 - Not symptomatic at this time though has not ambulated much around the room yet - Will plan for IV Venofer   Feeding: Breast Contraception: Undecided  Circumcision: Desires   Dispo: Plan for discharge PPD#2   LOS: 1 day   Lona Millard, MS3 02/13/2021, 7:25 AM   GME ATTESTATION:  I saw and evaluated the patient. I agree with the findings and the plan of care as documented in the student's note. I have made changes to documentation as necessary.   Evalina Field, MD OB Fellow, Faculty [facility], Center for [facility] 02/13/2021 8:44 AM

## 2021-02-13 NOTE — Lactation Note (Signed)
This note was copied from a baby's chart. Lactation Consultation Note  Patient Name: Heather Welch [id number] Date: 02/13/2021 Reason for consult: Initial assessment Age:33 hours P1, mother reports that she breastfed her first child for 6 months.  Infant has had several feeding attempts and one feeding. Mother reports that infant has been very sleepy. Infant is swaddled in swaddle blanket.  Mother advised to do STS frequently.  Encouraged mother to hand express and spoon feed infant when infant before feeding if infant doesn't wake for breastfeeding.   Assist mother with placing infant STS. Infant showing a few feeding cues. Infant latched and tugged a few times on and off. Mother reports feeling good depth.  Infant on and off for 5 mins. No observed swallows. Continued to attempt for 15-20 mins.  Encouraged  mother to hand express. Reviewed hand expression. Mother has good flow of colostrum. Mother to attempt to feed infant with feeding cues and unswaddle infant .  [facility] brochure given to mother with all basic teaching reviewed.    Maternal Data Has patient been taught Hand Expression?: Yes Does the patient have breastfeeding experience prior to this delivery?: Yes How long did the patient breastfeed?: 6 months  Feeding Mother's Current Feeding Choice: Breast Milk  LATCH Score Latch: Repeated attempts needed to sustain latch, nipple held in mouth throughout feeding, stimulation needed to elicit sucking reflex.  Audible Swallowing: None  Type of Nipple: Everted at rest and after stimulation  Comfort (Breast/Nipple): Soft / non-tender  Hold (Positioning): Full assist, staff holds infant at breast  LATCH Score: 5   Lactation Tools Discussed/Used    Interventions Interventions: Breast feeding basics reviewed;Assisted with latch;Skin to skin;Hand express;Breast compression;Adjust position;Support pillows;Position options;Education  Discharge    Consult Status Consult Status:  Follow-up Date: 02/14/21 Follow-up type: In-patient    Stevan Born [facility] 02/13/2021, 12:52 PM

## 2021-02-13 NOTE — Lactation Note (Signed)
This note was copied from a baby's chart. Lactation Consultation Note  Patient Name: Heather Welch [id number] Date: 02/13/2021 Reason for consult: Initial assessment Age:35 Hours Assist mother with latching infant. Infant in a alert wakeful state. Mother latched infant on in cradle hold. Infant sustained latch with effective swallows for 8 mins on one breast and 15 on alternate breast. Infant fed .5 ebm with a spoon and the breastfed 5 mins.  Mother was given a hand pump with instructions and fit with a #24 flange. Mother doing STS when [name] left the room.   Abnormal Labs:  Heather Welch 02/13/2021, 4:03 PM m  Maternal Data    Feeding    LATCH Score Latch: Grasps breast easily, tongue down, lips flanged, rhythmical sucking.  Audible Swallowing: A few with stimulation  Type of Nipple: Everted at rest and after stimulation  Comfort (Breast/Nipple): Soft / non-tender  Hold (Positioning): No assistance needed to correctly position infant at breast.  LATCH Score: 9   Lactation Tools Discussed/Used    Interventions    Discharge    Consult Status Consult Status: Follow-up Date: 02/14/21 Follow-up type: In-patient    Heather Welch [facility] 02/13/2021, 4:01 PM

## 2021-02-14 LAB — SURGICAL PATHOLOGY

## 2021-02-14 MED ORDER — NORETHINDRONE 0.35 MG PO TABS: 1.0000 | ORAL_TABLET | Freq: Every day | ORAL | 4 refills | Status: DC

## 2021-02-14 MED ORDER — OXYCODONE HCL 5 MG PO TABS: 5.0000 mg | ORAL_TABLET | Freq: Four times a day (QID) | ORAL | 0 refills | Status: AC | PRN

## 2021-02-14 MED ORDER — IBUPROFEN 600 MG PO TABS: 600.0000 mg | ORAL_TABLET | Freq: Four times a day (QID) | ORAL | 0 refills | Status: AC | PRN

## 2021-02-14 MED ORDER — DOCUSATE SODIUM 100 MG PO CAPS: 100.0000 mg | ORAL_CAPSULE | Freq: Two times a day (BID) | ORAL | 0 refills | Status: DC | PRN

## 2021-02-14 NOTE — Lactation Note (Signed)
This note was copied from a baby's chart. Lactation Consultation Note  Patient Name: Heather Welch [id number] Date: 02/14/2021 Reason for consult: Follow-up assessment;Term Age:33 hours   P2 mother whose infant is now 80 hours old.  This is a term baby at 39+4 weeks.  Mother breast fed her first child for 6 months.  Baby was asleep in mother's arms when I arrived; he just finished breast feeding.  Reviewed plan of care for after discharge.  Mother has been breast feeding on cue and will continue to feed 8-12 times/ 24 hours.  She has our OP phone number for any questions/concerns after discharge.  Family member present.   Maternal Data    Feeding Mother's Current Feeding Choice: Breast Milk  LATCH Score                    Lactation Tools Discussed/Used    Interventions Interventions: Education  Discharge Discharge Education: Engorgement and breast care Pump: Personal  Consult Status Consult Status: Complete Date: 02/14/21 Follow-up type: Call as needed    Afnan Emberton R Conception Doebler 02/14/2021, 1:08 PM

## 2021-02-14 NOTE — Progress Notes (Signed)
POSTPARTUM PROGRESS NOTE  Post Partum Day 2  Subjective:  Heather Welch is a 35 y.o. G2P2002 s/p rLTCS at [redacted]w[redacted]d.  No acute events overnight.  Pt denies problems with ambulating, voiding or po intake.  She denies nausea or vomiting.  Pain is well controlled.  She has had flatus. She has not had bowel movement.  Lochia Moderate.   Objective: Blood pressure (!) 98/59, pulse 72, temperature 98 F (36.7 C), temperature source Oral, resp. rate 16, height 5' (1.524 m), weight 80.3 kg, last menstrual period 05/24/2020, SpO2 97 %, currently breastfeeding.  Physical Exam:  General: alert, cooperative and no distress Lochia: appropriate  Uterine Fundus: firm, appropriately tender Incision: mild strike through on honeycomb dressing, no significant drainage, no significant erythema DVT Evaluation: No calf swelling or tenderness Extremities: no edema Skin: warm, dry; incision clean/dry/intact  Recent Labs    02/12/21 0247 02/13/21 0533  HGB 11.7* 8.3*  HCT 36.6  --     Assessment/Plan: Heather Welch is a 35 y.o. G2P2002 s/p rLTCS at [redacted]w[redacted]d. Pt continues to improve   PPD#2 - Doing well Contraception: Pills Feeding: Breast and bottle feeding  Circumcision: Desires  Dispo: Plan for discharge .  #Acute blood loss anemia -Cont. IV iron sucrose    LOS: 2 days   Lona Millard, Medical Student,  02/14/2021, 8:42 AM

## 2021-02-15 ENCOUNTER — Inpatient Hospital Stay (HOSPITAL_COMMUNITY): Admit: 2021-02-15 | Payer: Self-pay

## 2021-02-18 ENCOUNTER — Ambulatory Visit: Payer: Managed Care, Other (non HMO)

## 2021-02-25 ENCOUNTER — Telehealth (HOSPITAL_COMMUNITY): Payer: Self-pay | Admitting: *Deleted

## 2021-02-25 NOTE — Telephone Encounter (Signed)
Hospital discharge follow-up call   Duffy Rhody, RN 02-25-2021 at 2:10pm

## 2021-02-27 ENCOUNTER — Ambulatory Visit: Payer: Managed Care, Other (non HMO) | Admitting: Family Medicine

## 2021-02-28 ENCOUNTER — Other Ambulatory Visit: Payer: Self-pay

## 2021-02-28 ENCOUNTER — Ambulatory Visit (INDEPENDENT_AMBULATORY_CARE_PROVIDER_SITE_OTHER): Payer: Managed Care, Other (non HMO) | Admitting: Family Medicine

## 2021-02-28 VITALS — BP 115/75 | HR 85 | Wt 160.0 lb

## 2021-02-28 DIAGNOSIS — Z4889 Encounter for other specified surgical aftercare: Secondary | ICD-10-CM

## 2021-02-28 NOTE — Progress Notes (Signed)
Patient is two weeks post c-section. Patient is breastfeeding. Armandina Stammer RN

## 2021-02-28 NOTE — Progress Notes (Signed)
   Subjective:    Patient ID: Heather Welch, female    DOB: 1985/07/03, 35 y.o.   MRN: [id number]  HPI  Patient seen for incision check. She had a cesarean delivery due to fetal indications. She is breast feeding, pain well controlled. No problems. Mood normal.  Review of Systems     Objective:   Physical Exam Vitals reviewed.  Constitutional:      Appearance: Normal appearance.  Abdominal:     Comments: Incision well healed.  Skin:    Capillary Refill: Capillary refill takes less than 2 seconds.  Neurological:     General: No focal deficit present.     Mental Status: She is alert.  Psychiatric:        Mood and Affect: Mood normal.        Behavior: Behavior normal.        Thought Content: Thought content normal.      Assessment & Plan:  1. Encounter for post surgical wound check Well healed. F/u for pp visit in 2 weeks.

## 2021-03-10 ENCOUNTER — Telehealth: Payer: Self-pay

## 2021-03-10 NOTE — Telephone Encounter (Signed)
Patient is complaining of heavy bleeding and would like a phone call back.

## 2021-03-10 NOTE — Telephone Encounter (Signed)
Pt called stating her bleeding is heavier than normal for her. I explained to pt that this sounds like she is having her initial period after having her baby and the first period after giving birth is typically heavier. Pt advised to call the office if she starts to soak a pad every hour. Understanding was voiced. Heather Welch, CMA

## 2021-03-28 ENCOUNTER — Ambulatory Visit: Payer: Managed Care, Other (non HMO) | Admitting: Family Medicine

## 2021-04-16 ENCOUNTER — Ambulatory Visit (INDEPENDENT_AMBULATORY_CARE_PROVIDER_SITE_OTHER): Payer: Managed Care, Other (non HMO) | Admitting: Family Medicine

## 2021-04-16 ENCOUNTER — Encounter: Payer: Self-pay | Admitting: Family Medicine

## 2021-04-16 ENCOUNTER — Other Ambulatory Visit: Payer: Self-pay

## 2021-04-16 MED ORDER — NORETHINDRONE 0.35 MG PO TABS: 1.0000 | ORAL_TABLET | Freq: Every day | ORAL | 4 refills | Status: AC

## 2021-04-16 NOTE — Progress Notes (Signed)
Post Partum Visit Note  Heather Welch is a 35 y.o. G61P2002 female who presents for a postpartum visit. She is 8 weeks postpartum following a repeat cesarean section.  I have fully reviewed the prenatal and intrapartum course. The delivery was at 39 gestational weeks.  Anesthesia: spinal. Postpartum course has been noraml. Baby is doing well. Baby is feeding by breast. Bleeding no bleeding. Bowel function is normal. Bladder function is normal. Patient is not sexually active. Contraception method is OCP (estrogen/progesterone). Postpartum depression screening: negative.   The pregnancy intention screening data noted above was reviewed. Potential methods of contraception were discussed. The patient elected to proceed with No data recorded.   Edinburgh Postnatal Depression Scale - 04/16/21 1059       Edinburgh Postnatal Depression Scale:  In the Past 7 Days   I have been able to laugh and see the funny side of things. 0    I have looked forward with enjoyment to things. 0    I have blamed myself unnecessarily when things went wrong. 0    I have been anxious or worried for no good reason. 1    I have felt scared or panicky for no good reason. 0    Things have been getting on top of me. 0    I have been so unhappy that I have had difficulty sleeping. 0    I have felt sad or miserable. 0    I have been so unhappy that I have been crying. 0    The thought of harming myself has occurred to me. 0    Edinburgh Postnatal Depression Scale Total 1             Health Maintenance Due  Topic Date Due   COVID-19 Vaccine (1) Never done   INFLUENZA VACCINE  Never done    The following portions of the patient's history were reviewed and updated as appropriate: allergies, current medications, past family history, past medical history, past social history, past surgical history, and problem list.  Review of Systems Pertinent items are noted in HPI.  Objective:  BP 123/73   Pulse 91   Wt 165  lb (74.8 kg)   LMP 05/24/2020   Breastfeeding Yes   BMI 32.22 kg/m    General:  alert, cooperative, and no distress   Breasts:  not indicated  Lungs: clear to auscultation bilaterally  Heart:  regular rate and rhythm, S1, S2 normal, no murmur, click, rub or gallop  Abdomen: soft, non-tender; bowel sounds normal; no masses,  no organomegaly   Wound well approximated incision  GU exam:  not indicated       Assessment:   1. Postpartum exam   Plan:   Essential components of care per ACOG recommendations:  1.  Mood and well being: Patient with negative depression screening today. Reviewed local resources for support.  - Patient tobacco use? No.   - hx of drug use? No.    2. Infant care and feeding:  -Patient currently breastmilk feeding? Yes. Reviewed importance of draining breast regularly to support lactation.  -Social determinants of health (SDOH) reviewed in EPIC. No concerns  3. Sexuality, contraception and birth spacing - Patient does not want a pregnancy in the next year.   - Reviewed forms of contraception in tiered fashion. Patient desired no method today.   - Discussed birth spacing of 18 months  4. Sleep and fatigue -Encouraged family/partner/community support of 4 hrs of uninterrupted sleep to help  with mood and fatigue  5. Physical Recovery  - Discussed patients delivery and complications. She describes her labor as mixed. - Patient had a C-section failure to progress.  - Patient has urinary incontinence? no - Patient is safe to resume physical and sexual activity  6.  Health Maintenance - HM due items addressed Yes - Last pap smear  Diagnosis  Date Value Ref Range Status  10/02/2020   Final   - Negative for intraepithelial lesion or malignancy (NILM)   Pap smear not done at today's visit.  -Breast Cancer screening indicated? No.   7. Chronic Disease/Pregnancy Condition follow up: None  - PCP follow up  Levie Heritage, DO Center for [facility]  Healthcare, [facility]

## 2021-05-12 ENCOUNTER — Encounter: Payer: Self-pay | Admitting: General Practice

## 2021-06-09 ENCOUNTER — Telehealth (HOSPITAL_COMMUNITY): Payer: Self-pay | Admitting: Lactation Services

## 2021-06-09 NOTE — Telephone Encounter (Signed)
LC returned the call to this mom - per mom milk supply since Friday.  Recovering the Covid Virus. Was taking Sudafed .  LC recommended to stop the Sudafed today, and post pump after 2-3 feedings both breast after the baby is fed to increase supply, and extra fluids. Instead of Sudafed, take allergy med without the DM in it if needed.  Keep a pumping diary and if the supply doesn't increase to call the [facility] office back.

## 2021-08-29 IMAGING — US US MFM OB FOLLOW-UP
1 series · 13 of 28 positions shown · non-contrast
Comparison: none

[Series 1: us mfm ob follow-up · 88 acquisitions, 13 frames shown]
[im 4/88]
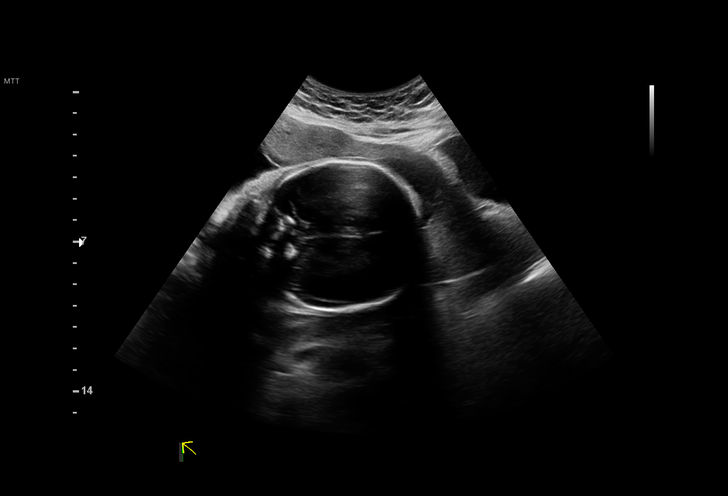
[im 10/88]
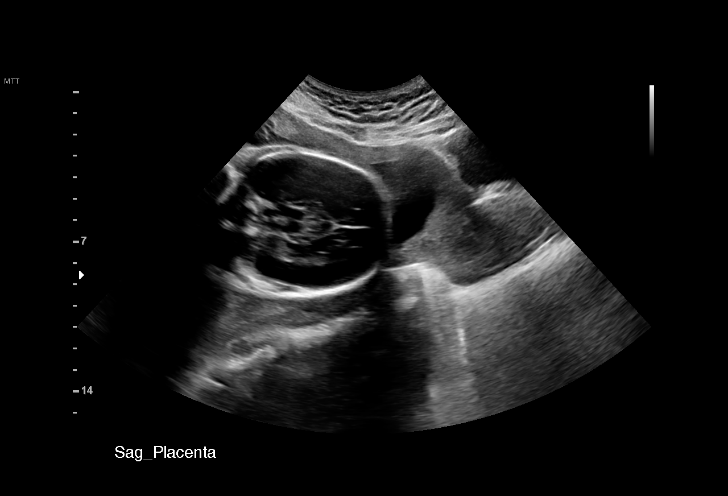
[im 17/88]
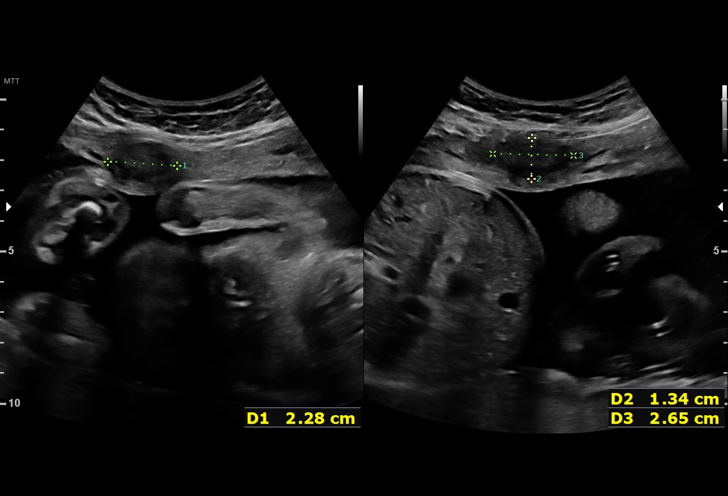
[im 23/88]
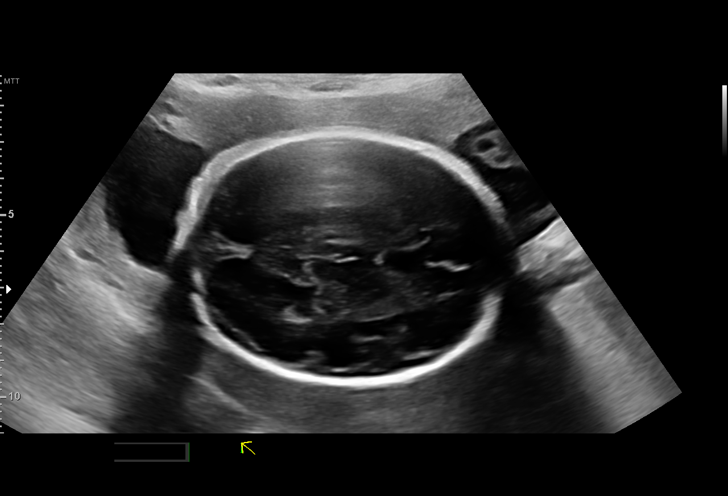
[im 30/88]
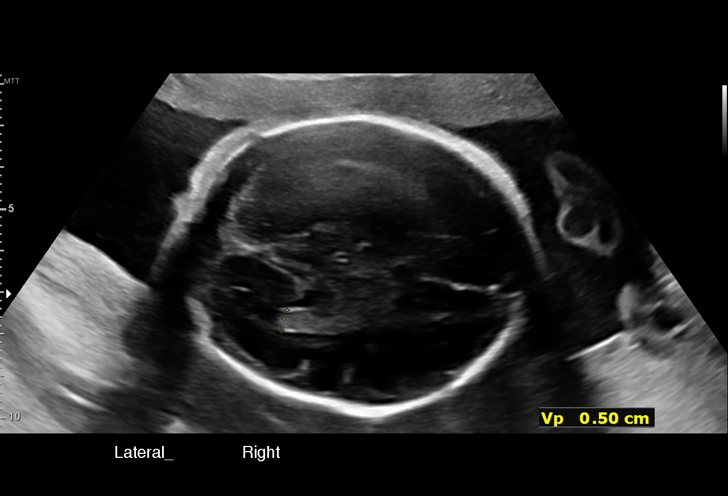
[im 36/88]
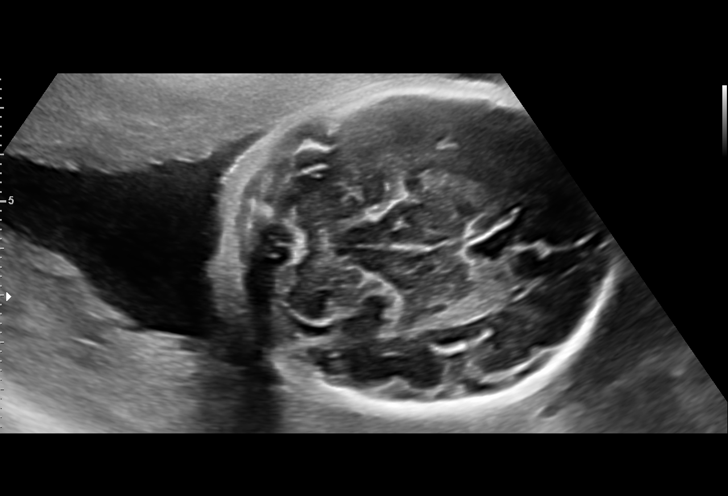
[im 46/88]
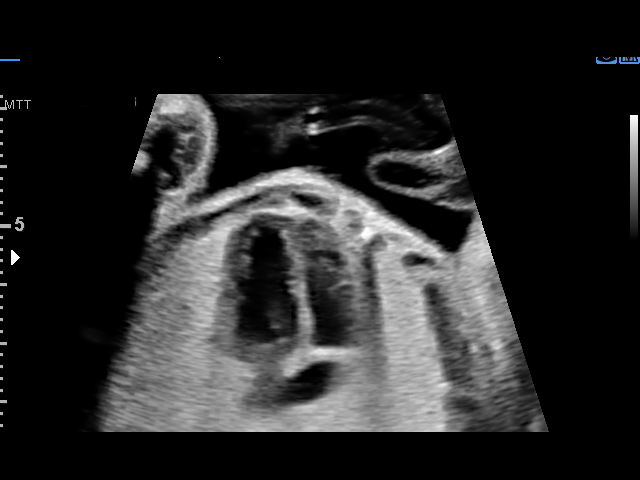
[im 52/88]
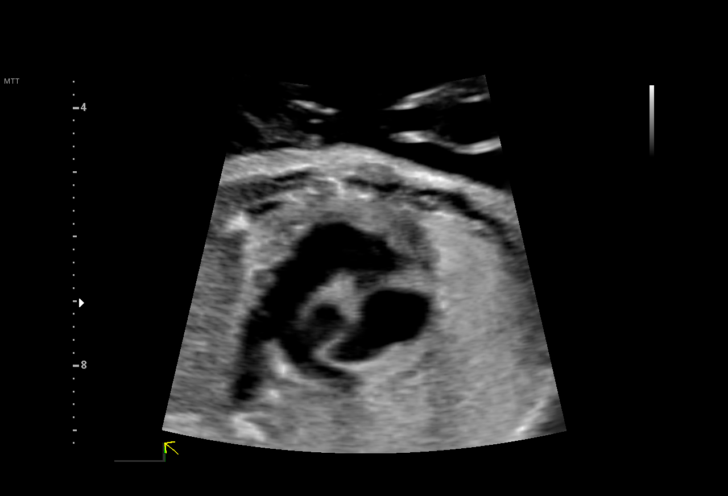
[im 59/88]
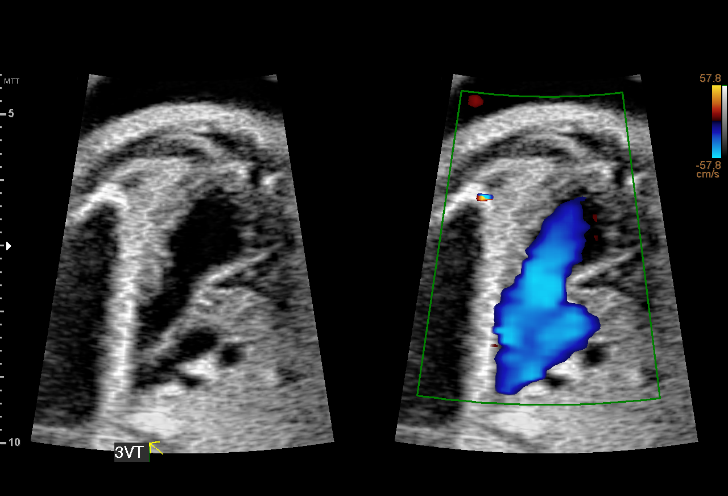
[im 65/88]
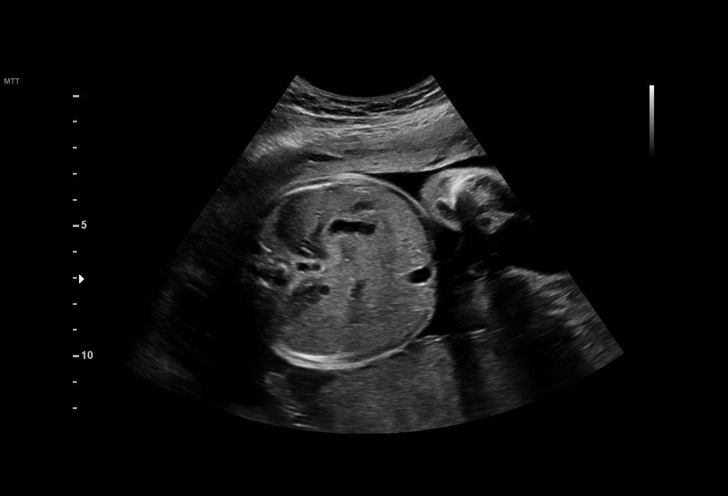
[im 71/88]
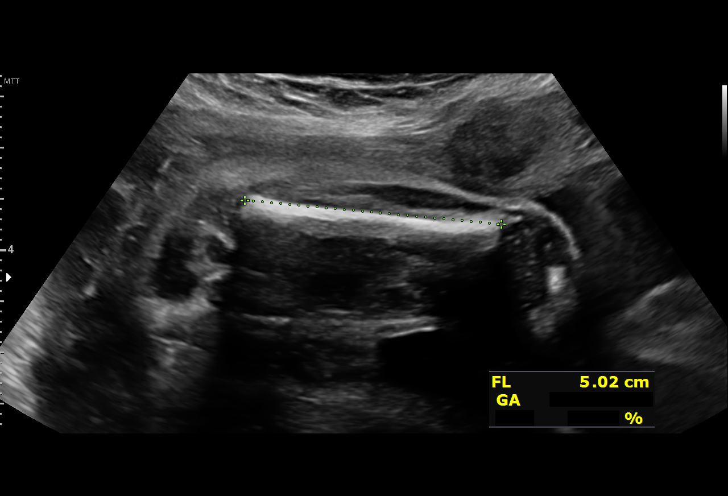
[im 78/88]
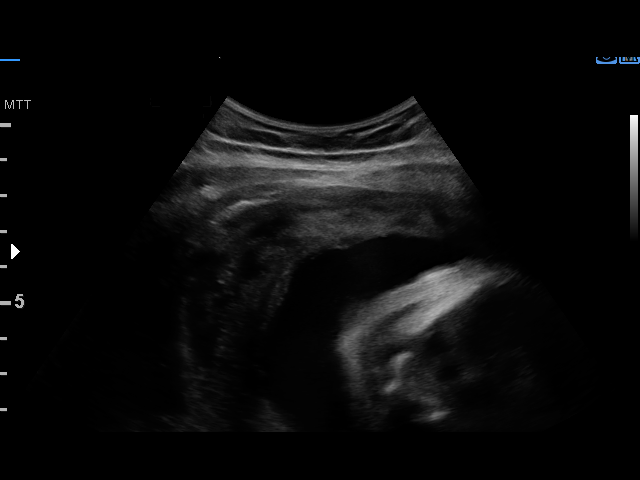
[im 84/88]
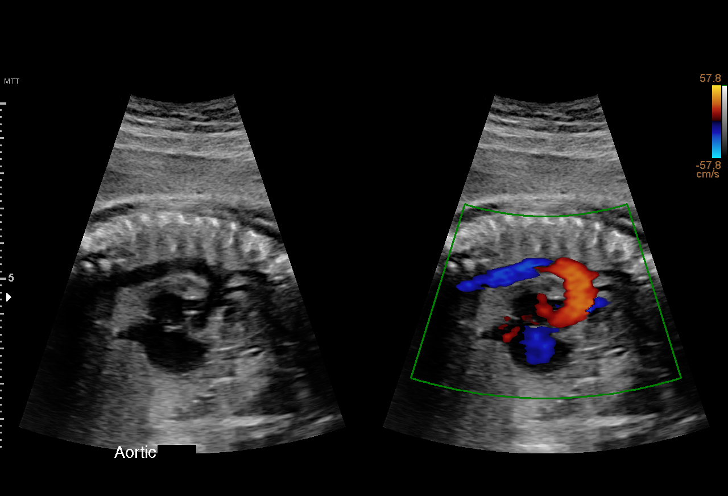

[13 of 28 positions shown; findings below may reference images not displayed]

[id number]

Indications

 27 weeks gestation of pregnancy
 Encounter for other antenatal screening
 follow-up
 Encounter for antenatal screening for
 malformations (no testing)
 Medical complication of pregnancy (Left
 oophorectomy)
 Other mental disorder complicating
 pregnancy, second trimester (anxiety)
 History of cesarean delivery, currently
 pregnant
Fetal Evaluation

 Num Of Fetuses:         1
 Fetal Heart Rate(bpm):  140
 Cardiac Activity:       Observed
 Presentation:           Cephalic
 Placenta:               Posterior
 P. Cord Insertion:      Previously Visualized

 Amniotic Fluid
 AFI FV:      Within normal limits

 AFI Sum(cm)     %Tile       Largest Pocket(cm)
 19              75
 RUQ(cm)       RLQ(cm)       LUQ(cm)        LLQ(cm)

Biometry

 BPD:      67.6  mm     G. Age:  27w 2d         36  %    CI:        70.77   %    70 - 86
                                                         FL/HC:      19.3   %    18.6 -
 HC:      256.1  mm     G. Age:  27w 6d         38  %    HC/AC:      1.05        1.05 -
 AC:      244.4  mm     G. Age:  28w 5d         82  %    FL/BPD:     73.1   %    71 - 87
 FL:       49.4  mm     G. Age:  26w 5d         18  %    FL/AC:      20.2   %    20 - 24
 CER:      34.1  mm     G. Age:  28w 6d         93  %

 LV:          5  mm
 CM:        3.9  mm

 Est. FW:    9988  gm      2 lb 8 oz     60  %
OB History

 Gravidity:    2
 Living:       1
Gestational Age

 LMP:           25w 3d        Date:  05/24/20                 EDD:   02/28/21
 U/S Today:     27w 5d                                        EDD:   02/12/21
 Best:          27w 2d     Det. By:  U/S  (10/18/20)          EDD:   02/15/21
Anatomy

 Cranium:               Appears normal         Aortic Arch:            Appears normal
 Cavum:                 Appears normal         Ductal Arch:            Appears normal
 Ventricles:            Appears normal         Diaphragm:              Appears normal
 Choroid Plexus:        Appears normal         Stomach:                Appears normal, left
                                                                       sided
 Cerebellum:            Appears normal         Abdomen:                Previously seen
 Posterior Fossa:       Appears normal         Abdominal Wall:         Previously seen
 Face:                  Appears normal         Cord Vessels:           Previously seen
                        (orbits and profile)
 Lips:                  Appears normal         Kidneys:                Appear normal
 Palate:                Not well visualized    Bladder:                Appears normal
 Thoracic:              Appears normal         Spine:                  Appears normal
 Heart:                 Appears normal         Upper Extremities:      Previously seen
                        (4CH, axis, and
                        situs)
 RVOT:                  Appears normal         Lower Extremities:      Previously seen
 LVOT:                  Appears normal

 Other:  Male gender previously seen. Heels/feet and open hands/5th digits
         visualized previously. Nasal bone previously seen. Technically difficult
         due to fetal position.
Cervix Uterus Adnexa

 Cervix
 Length:           4.51  cm.
 Normal appearance by transabdominal scan.
 Uterus
 No abnormality visualized.

 Right Ovary
 Within normal limits.

 Left Ovary
 Surgically absent. No adnexal mass visualized.

 Cul De Sac
 No free fluid seen.

 Adnexa
 No abnormality visualized.
Impression

 Patient returned for fetal growth assessment .
 Amniotic fluid is normal and good fetal activity is seen .Fetal
 growth is appropriate for gestational age .Good interval
 growth is seen.
Recommendations

 Follow-up scans as clinically indicated.
                 Vaden, Espie

## 2022-02-18 ENCOUNTER — Encounter: Payer: Self-pay | Admitting: General Practice
